# Patient Record
Sex: Female | Born: 1995 | Race: Black or African American | Hispanic: No | Marital: Single | Smoking: Current every day smoker
Health system: Southern US, Community
[De-identification: ages and names within clinical notes are randomized; demographics above are authoritative.]

## PROBLEM LIST (undated history)

## (undated) DIAGNOSIS — E119 Type 2 diabetes mellitus without complications: Secondary | ICD-10-CM

## (undated) HISTORY — PX: HERNIA REPAIR: SHX51

## (undated) HISTORY — PX: TONSILLECTOMY: SUR1361

---
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
## 2015-01-14 ENCOUNTER — Emergency Department: Payer: Self-pay | Admitting: Internal Medicine

## 2015-01-14 LAB — URIC ACID: Uric Acid: 6.8 mg/dL — ABNORMAL HIGH

## 2015-03-21 ENCOUNTER — Emergency Department
Admission: EM | Admit: 2015-03-21 | Discharge: 2015-03-21 | Discharge status: Against Medical Advice | Payer: Medicaid Other | Attending: Emergency Medicine | Admitting: Emergency Medicine

## 2015-03-21 ENCOUNTER — Encounter: Payer: Self-pay | Admitting: Emergency Medicine

## 2015-03-21 DIAGNOSIS — R1030 Lower abdominal pain, unspecified: Secondary | ICD-10-CM | POA: Insufficient documentation

## 2015-03-21 LAB — COMPREHENSIVE METABOLIC PANEL
ALT: 14 U/L (ref 14–54)
AST: 15 U/L (ref 15–41)
Albumin: 4.4 g/dL (ref 3.5–5.0)
Alkaline Phosphatase: 69 U/L (ref 38–126)
Anion gap: 6 (ref 5–15)
BILIRUBIN TOTAL: 0.3 mg/dL (ref 0.3–1.2)
BUN: 15 mg/dL (ref 6–20)
CO2: 28 mmol/L (ref 22–32)
Calcium: 9.2 mg/dL (ref 8.9–10.3)
Chloride: 107 mmol/L (ref 101–111)
Creatinine, Ser: 0.69 mg/dL (ref 0.44–1.00)
GFR calc non Af Amer: >60 mL/min (ref >60)
GLUCOSE: 93 mg/dL (ref 65–99)
Potassium: 4.2 mmol/L (ref 3.5–5.1)
SODIUM: 141 mmol/L (ref 135–145)
Total Protein: 7.9 g/dL (ref 6.5–8.1)

## 2015-03-21 LAB — URINALYSIS COMPLETE WITH MICROSCOPIC (ARMC ONLY)
Bilirubin Urine: NEGATIVE
GLUCOSE, UA: NEGATIVE mg/dL
Ketones, ur: NEGATIVE mg/dL
NITRITE: POSITIVE — AB
PROTEIN: 100 mg/dL — AB
Specific Gravity, Urine: 1.019 (ref 1.005–1.030)
pH: 6 (ref 5.0–8.0)

## 2015-03-21 LAB — CBC WITH DIFFERENTIAL/PLATELET
Basophils Absolute: 0.1 10*3/uL (ref 0–0.1)
Basophils Relative: 1 %
EOS PCT: 3 %
Eosinophils Absolute: 0.3 10*3/uL (ref 0–0.7)
HCT: 43.5 % (ref 35.0–47.0)
Hemoglobin: 13.8 g/dL (ref 12.0–16.0)
Lymphocytes Relative: 28 %
Lymphs Abs: 3.5 10*3/uL (ref 1.0–3.6)
MCH: 27.8 pg (ref 26.0–34.0)
MCHC: 31.7 g/dL — ABNORMAL LOW (ref 32.0–36.0)
MCV: 87.8 fL (ref 80.0–100.0)
MONOS PCT: 6 %
Monocytes Absolute: 0.7 10*3/uL (ref 0.2–0.9)
Neutro Abs: 7.8 10*3/uL — ABNORMAL HIGH (ref 1.4–6.5)
Neutrophils Relative %: 62 %
Platelets: 272 10*3/uL (ref 150–440)
RBC: 4.96 MIL/uL (ref 3.80–5.20)
RDW: 13.8 % (ref 11.5–14.5)
WBC: 12.4 10*3/uL — AB (ref 3.6–11.0)

## 2015-03-21 LAB — POCT PREGNANCY, URINE: Preg Test, Ur: NEGATIVE

## 2015-03-21 LAB — LIPASE, BLOOD: Lipase: 29 U/L (ref 22–51)

## 2015-03-21 NOTE — ED Notes (Signed)
Patient ambulatory to triage with steady gait, without difficulty or distress noted; pt reports x 2 days having lower abd pain radiating into pelvis with no accomp symptoms

## 2015-03-22 ENCOUNTER — Telehealth: Payer: Self-pay | Admitting: Emergency Medicine

## 2015-06-13 ENCOUNTER — Encounter: Payer: Self-pay | Admitting: Emergency Medicine

## 2015-06-13 ENCOUNTER — Emergency Department
Admission: EM | Admit: 2015-06-13 | Discharge: 2015-06-14 | Disposition: A | Discharge status: Home / Self Care | Payer: Medicaid Other | Attending: Student | Admitting: Student

## 2015-06-13 ENCOUNTER — Emergency Department: Payer: Medicaid Other

## 2015-06-13 DIAGNOSIS — R110 Nausea: Secondary | ICD-10-CM | POA: Insufficient documentation

## 2015-06-13 DIAGNOSIS — R1013 Epigastric pain: Secondary | ICD-10-CM

## 2015-06-13 DIAGNOSIS — Z3202 Encounter for pregnancy test, result negative: Secondary | ICD-10-CM | POA: Insufficient documentation

## 2015-06-13 DIAGNOSIS — I10 Essential (primary) hypertension: Secondary | ICD-10-CM | POA: Diagnosis not present

## 2015-06-13 DIAGNOSIS — Z79899 Other long term (current) drug therapy: Secondary | ICD-10-CM | POA: Diagnosis not present

## 2015-06-13 DIAGNOSIS — K8020 Calculus of gallbladder without cholecystitis without obstruction: Secondary | ICD-10-CM | POA: Insufficient documentation

## 2015-06-13 DIAGNOSIS — R52 Pain, unspecified: Secondary | ICD-10-CM

## 2015-06-13 LAB — URINALYSIS COMPLETE WITH MICROSCOPIC (ARMC ONLY)
Bacteria, UA: NONE SEEN
Bilirubin Urine: NEGATIVE
Glucose, UA: NEGATIVE mg/dL
Ketones, ur: NEGATIVE mg/dL
Leukocytes, UA: NEGATIVE
Nitrite: NEGATIVE
PH: 5 (ref 5.0–8.0)
PROTEIN: NEGATIVE mg/dL
Specific Gravity, Urine: 1.026 (ref 1.005–1.030)

## 2015-06-13 LAB — BASIC METABOLIC PANEL
Anion gap: 8 (ref 5–15)
BUN: 14 mg/dL (ref 6–20)
CALCIUM: 8.9 mg/dL (ref 8.9–10.3)
CHLORIDE: 108 mmol/L (ref 101–111)
CO2: 25 mmol/L (ref 22–32)
CREATININE: 0.72 mg/dL (ref 0.44–1.00)
GFR calc non Af Amer: >60 mL/min (ref >60)
Glucose, Bld: 97 mg/dL (ref 65–99)
Potassium: 3.6 mmol/L (ref 3.5–5.1)
Sodium: 141 mmol/L (ref 135–145)

## 2015-06-13 LAB — LIPASE, BLOOD: Lipase: 22 U/L (ref 22–51)

## 2015-06-13 LAB — CBC WITH DIFFERENTIAL/PLATELET
BASOS PCT: 1 %
Basophils Absolute: 0.1 10*3/uL (ref 0–0.1)
Eosinophils Absolute: 0.5 10*3/uL (ref 0–0.7)
Eosinophils Relative: 5 %
HEMATOCRIT: 42 % (ref 35.0–47.0)
HEMOGLOBIN: 13.5 g/dL (ref 12.0–16.0)
Lymphocytes Relative: 41 %
Lymphs Abs: 4 10*3/uL — ABNORMAL HIGH (ref 1.0–3.6)
MCH: 28.1 pg (ref 26.0–34.0)
MCHC: 32.1 g/dL (ref 32.0–36.0)
MCV: 87.5 fL (ref 80.0–100.0)
Monocytes Absolute: 0.5 10*3/uL (ref 0.2–0.9)
Monocytes Relative: 5 %
NEUTROS ABS: 4.9 10*3/uL (ref 1.4–6.5)
NEUTROS PCT: 50 %
Platelets: 275 10*3/uL (ref 150–440)
RBC: 4.8 MIL/uL (ref 3.80–5.20)
RDW: 13.7 % (ref 11.5–14.5)
WBC: 9.9 10*3/uL (ref 3.6–11.0)

## 2015-06-13 LAB — PREGNANCY, URINE: Preg Test, Ur: NEGATIVE

## 2015-06-13 NOTE — ED Provider Notes (Signed)
[facility] Emergency Department Provider Note  ____________________________________________  Time seen: Approximately 10:17 PM  I have reviewed the triage vital signs and the nursing notes.   HISTORY  Chief Complaint Abdominal Pain; Nausea; and Diarrhea    HPI Bonnie Duncan is a 19 y.o. female with history of diabetes who presents for evaluation of one week intermittent epigastric abdominal pain, worse with eating associated with nausea, gradual onset. She reports that the pain is "sharp". She has also had intermittent nonbloody diarrhea. No fevers or chills. No chest pain or difficulty breathing. Currently her symptoms are mild. She has had no vomiting.   History reviewed. No pertinent past medical history.  Patient Active Problem List   Diagnosis Date Noted  . Obesity 02/04/2011  . Pre-diabetes 02/04/2011  . Essential hypertension, benign 02/04/2011  . Acquired acanthosis nigricans 02/04/2011    History reviewed. No pertinent past surgical history.  Current Outpatient Rx  Name  Route  Sig  Dispense  Refill  . metFORMIN (GLUCOPHAGE) 500 MG tablet   Oral   Take 500 mg by mouth 2 (two) times daily with a meal.             Allergies Review of patient's allergies indicates no known allergies.  History reviewed. No pertinent family history.  Social History Social History  Substance Use Topics  . Smoking status: Never Smoker   . Smokeless tobacco: None  . Alcohol Use: No    Review of Systems Constitutional: No fever/chills Eyes: No visual changes. ENT: No sore throat. Cardiovascular: Denies chest pain. Respiratory: Denies shortness of breath. Gastrointestinal: = abdominal pain.  + nausea, no vomiting.  + diarrhea.  No constipation. Genitourinary: Negative for dysuria. Musculoskeletal: Negative for back pain. Skin: Negative for rash. Neurological: Negative for headaches, focal weakness or numbness.  10-point ROS otherwise  negative.  ____________________________________________   PHYSICAL EXAM:  VITAL SIGNS: ED Triage Vitals  Enc Vitals Group     BP 06/13/15 2150 137/68 mmHg     Pulse Rate 06/13/15 2150 86     Resp 06/13/15 2150 18     Temp 06/13/15 2150 98.2 F (36.8 C)     Temp Source 06/13/15 2150 Oral     SpO2 06/13/15 2150 99 %     Weight 06/13/15 2150 293 lb (132.904 kg)     Height 06/13/15 2150 5\' 1"  (1.549 m)     Head Cir --      Peak Flow --      Pain Score 06/13/15 2150 9     Pain Loc --      Pain Edu? --      Excl. in GC? --     Constitutional: Alert and oriented. Well appearing and in no acute distress. Eyes: Conjunctivae are normal. PERRL. EOMI. Head: Atraumatic. Nose: No congestion/rhinnorhea. Mouth/Throat: Mucous membranes are moist.  Oropharynx non-erythematous. Neck: No stridor.  Cardiovascular: Normal rate, regular rhythm. Grossly normal heart sounds.  Good peripheral circulation. Respiratory: Normal respiratory effort.  No retractions. Lungs CTAB. Gastrointestinal: Obese abdomen with mild tenderness to palpation in the epigastrium, no rebound or guarding. Genitourinary: deferred Musculoskeletal: No lower extremity tenderness nor edema.  No joint effusions. Neurologic:  Normal speech and language. No gross focal neurologic deficits are appreciated. No gait instability. Skin:  Skin is warm, dry and intact. No rash noted. Psychiatric: Mood and affect are normal. Speech and behavior are normal.  ____________________________________________   LABS (all labs ordered are listed, but only abnormal results are  displayed)  Labs Reviewed  URINALYSIS COMPLETEWITH MICROSCOPIC ([facility] ONLY) - Abnormal; Notable for the following:    Color, Urine YELLOW (*)    APPearance CLEAR (*)    Hgb urine dipstick 1+ (*)    Squamous Epithelial / LPF 0-5 (*)    All other components within normal limits  CBC WITH DIFFERENTIAL/PLATELET - Abnormal; Notable for the following:    Lymphs Abs 4.0  (*)    All other components within normal limits  BASIC METABOLIC PANEL  LIPASE, BLOOD  PREGNANCY, URINE  HEPATIC FUNCTION PANEL  POC URINE PREG, ED   ____________________________________________  EKG  None ____________________________________________  RADIOLOGY  Right upper quadrant ultrasound pending ____________________________________________   PROCEDURES  Procedure(s) performed: None  Critical Care performed: No  ____________________________________________   INITIAL IMPRESSION / ASSESSMENT AND PLAN / ED COURSE  Pertinent labs & imaging results that were available during my care of the patient were reviewed by me and considered in my medical decision making (see chart for details).  Bonnie Duncan is a 19 y.o. female with history of diabetes who presents for evaluation of one week intermittent epigastric abdominal pain, worse with eating associated with nausea, gradual onset. On exam, she is very well-appearing and in no acute distress. Vital signs stable, she is afebrile. Mild tenderness to palpation in the epigastrium. Normal BMP. Normal lipase. Normal CBC. Normal urinalysis. Negative pregnancy. Possibly viral syndrome however Hepatic function panel pending as is right upper quadrant ultrasound to evaluate for any acute gallbladder pathology. Care transferred to Dr. Manson Passey at 11:35 PM. ____________________________________________   FINAL CLINICAL IMPRESSION(S) / ED DIAGNOSES  Final diagnoses:  Pain  Acute epigastric pain  Nausea      Gayla Doss, MD 06/13/15 2336

## 2015-06-14 LAB — HEPATIC FUNCTION PANEL
ALT: 13 U/L — ABNORMAL LOW (ref 14–54)
AST: 24 U/L (ref 15–41)
Albumin: 4.1 g/dL (ref 3.5–5.0)
Alkaline Phosphatase: 59 U/L (ref 38–126)
Total Bilirubin: 0.4 mg/dL (ref 0.3–1.2)
Total Protein: 7.2 g/dL (ref 6.5–8.1)

## 2015-06-14 MED ORDER — ONDANSETRON 4 MG PO TBDP: 4.0000 mg | ORAL_TABLET | Freq: Three times a day (TID) | ORAL | Status: DC | PRN

## 2015-06-14 MED ORDER — OXYCODONE-ACETAMINOPHEN 5-325 MG PO TABS: 1.0000 | ORAL_TABLET | ORAL | Status: DC | PRN

## 2015-06-14 NOTE — ED Notes (Signed)
Patient transported to US 

## 2015-06-14 NOTE — ED Provider Notes (Signed)
I assumed care of the patient from Dr. Inocencio Homes at 11:30 PM. Patient's ultrasound the abdomen revealed: US Abdomen Limited RUQ (Final result) Result time: 06/14/15 01:12:44   Final result by Rad Results In Interface (06/14/15 01:12:44)   Narrative:   CLINICAL DATA: Abdominal pain for 1 week.  EXAM: US ABDOMEN LIMITED - RIGHT UPPER QUADRANT  COMPARISON: 01/07/2013  FINDINGS: Gallbladder:  Multiple stones identified in the gallbladder. Largest measuring about 8 mm. Gallbladder is somewhat contracted. No wall thickening. Murphy's sign is negative.  Common bile duct:  Diameter: 2.2 mm common normal  Liver:  No focal lesion identified. Within normal limits in parenchymal echogenicity.  IMPRESSION: Cholelithiasis with multiple stones demonstrated in the gallbladder. No additional inflammatory changes.   Electronically Signed By: Burman Nieves M.D. On: 06/14/2015 01:12      Patient was informed of all clinical findings including the ultrasound. Patient will be referred to Dr. Michela Pitcher for outpatient follow-up.  Darci Current, MD 06/14/15 [phone]

## 2015-06-14 NOTE — Discharge Instructions (Signed)
Cholelithiasis °Cholelithiasis (also called gallstones) is a form of gallbladder disease in which gallstones form in your gallbladder. The gallbladder is an organ that stores bile made in the liver, which helps digest fats. Gallstones begin as small crystals and slowly grow into stones. Gallstone pain occurs when the gallbladder spasms and a gallstone is blocking the duct. Pain can also occur when a stone passes out of the duct.  °RISK FACTORS °· Being female.   °· Having multiple pregnancies. Health care providers sometimes advise removing diseased gallbladders before future pregnancies.   °· Being obese. °· Eating a diet heavy in fried foods and fat.   °· Being older than 60 years and increasing age.   °· Prolonged use of medicines containing female hormones.   °· Having diabetes mellitus.   °· Rapidly losing weight.   °· Having a family history of gallstones (heredity).   °SYMPTOMS °· Nausea.   °· Vomiting. °· Abdominal pain.   °· Yellowing of the skin (jaundice).   °· Sudden pain. It may persist from several minutes to several hours. °· Fever.   °· Tenderness to the touch.  °In some cases, when gallstones do not move into the bile duct, people have no pain or symptoms. These are called "silent" gallstones.  °TREATMENT °Silent gallstones do not need treatment. In severe cases, emergency surgery may be required. Options for treatment include: °· Surgery to remove the gallbladder. This is the most common treatment. °· Medicines. These do not always work and may take 6-12 months or more to work. °· Shock wave treatment (extracorporeal biliary lithotripsy). In this treatment an ultrasound machine sends shock waves to the gallbladder to break gallstones into smaller pieces that can pass into the intestines or be dissolved by medicine. °HOME CARE INSTRUCTIONS  °· Only take over-the-counter or prescription medicines for pain, discomfort, or fever as directed by your health care provider.   °· Follow a low-fat diet until  seen again by your health care provider. Fat causes the gallbladder to contract, which can result in pain.   °· Follow up with your health care provider as directed. Attacks are almost always recurrent and surgery is usually required for permanent treatment.   °SEEK IMMEDIATE MEDICAL CARE IF:  °· Your pain increases and is not controlled by medicines.   °· You have a fever or persistent symptoms for more than 2-3 days.   °· You have a fever and your symptoms suddenly get worse.   °· You have persistent nausea and vomiting.   °MAKE SURE YOU:  °· Understand these instructions. °· Will watch your condition. °· Will get help right away if you are not doing well or get worse. °Document Released: 10/02/2005 Document Revised: 06/08/2013 Document Reviewed: 03/30/2013 °[employer]® Patient Information ©2015 [employer]. This information is not intended to replace advice given to you by your health care provider. Make sure you discuss any questions you have with your health care provider. ° °

## 2015-06-14 NOTE — ED Notes (Signed)
Patient returned from US.

## 2016-04-12 ENCOUNTER — Encounter: Payer: Self-pay | Admitting: *Deleted

## 2016-04-12 ENCOUNTER — Other Ambulatory Visit: Payer: Self-pay

## 2016-04-12 ENCOUNTER — Emergency Department: Payer: Self-pay

## 2016-04-12 ENCOUNTER — Emergency Department
Admission: EM | Admit: 2016-04-12 | Discharge: 2016-04-12 | Disposition: A | Discharge status: Home / Self Care | Payer: Self-pay | Attending: Student | Admitting: Student

## 2016-04-12 DIAGNOSIS — E119 Type 2 diabetes mellitus without complications: Secondary | ICD-10-CM | POA: Insufficient documentation

## 2016-04-12 DIAGNOSIS — L309 Dermatitis, unspecified: Secondary | ICD-10-CM | POA: Insufficient documentation

## 2016-04-12 DIAGNOSIS — R0789 Other chest pain: Secondary | ICD-10-CM | POA: Insufficient documentation

## 2016-04-12 DIAGNOSIS — I10 Essential (primary) hypertension: Secondary | ICD-10-CM | POA: Insufficient documentation

## 2016-04-12 HISTORY — DX: Type 2 diabetes mellitus without complications: E11.9

## 2016-04-12 LAB — COMPREHENSIVE METABOLIC PANEL
ALT: 17 U/L (ref 14–54)
AST: 19 U/L (ref 15–41)
Albumin: 4.4 g/dL (ref 3.5–5.0)
Alkaline Phosphatase: 66 U/L (ref 38–126)
Anion gap: 7 (ref 5–15)
BILIRUBIN TOTAL: 0.3 mg/dL (ref 0.3–1.2)
BUN: 13 mg/dL (ref 6–20)
CO2: 23 mmol/L (ref 22–32)
CREATININE: 0.72 mg/dL (ref 0.44–1.00)
Calcium: 9.1 mg/dL (ref 8.9–10.3)
Chloride: 106 mmol/L (ref 101–111)
GFR calc Af Amer: >60 mL/min (ref >60)
Glucose, Bld: 108 mg/dL — ABNORMAL HIGH (ref 65–99)
POTASSIUM: 4.2 mmol/L (ref 3.5–5.1)
Sodium: 136 mmol/L (ref 135–145)
Total Protein: 7.8 g/dL (ref 6.5–8.1)

## 2016-04-12 LAB — CBC
HEMATOCRIT: 42.1 % (ref 35.0–47.0)
Hemoglobin: 14.3 g/dL (ref 12.0–16.0)
MCH: 29.2 pg (ref 26.0–34.0)
MCHC: 34 g/dL (ref 32.0–36.0)
MCV: 85.9 fL (ref 80.0–100.0)
PLATELETS: 250 10*3/uL (ref 150–440)
RBC: 4.91 MIL/uL (ref 3.80–5.20)
RDW: 13.5 % (ref 11.5–14.5)
WBC: 10.3 10*3/uL (ref 3.6–11.0)

## 2016-04-12 LAB — TROPONIN I: Troponin I: <0.03 ng/mL (ref <0.031)

## 2016-04-12 LAB — POCT PREGNANCY, URINE: Preg Test, Ur: NEGATIVE

## 2016-04-12 LAB — FIBRIN DERIVATIVES D-DIMER (ARMC ONLY): FIBRIN DERIVATIVES D-DIMER (ARMC): 512 — AB (ref 0–499)

## 2016-04-12 MED ORDER — KETOROLAC TROMETHAMINE 30 MG/ML IJ SOLN
15.0000 mg | Freq: Once | INTRAMUSCULAR | Status: AC
  Administered 2016-04-12: 15 mg via INTRAVENOUS
  Filled 2016-04-12: qty 1

## 2016-04-12 MED ORDER — SODIUM CHLORIDE 0.9 % IV BOLUS (SEPSIS)
500.0000 mL | Freq: Once | INTRAVENOUS | Status: AC
  Administered 2016-04-12: 500 mL via INTRAVENOUS

## 2016-04-12 MED ORDER — IOPAMIDOL (ISOVUE-370) INJECTION 76%
75.0000 mL | Freq: Once | INTRAVENOUS | Status: AC | PRN
  Administered 2016-04-12: 75 mL via INTRAVENOUS

## 2016-04-12 MED ORDER — NAPROXEN 500 MG PO TABS: 500.0000 mg | ORAL_TABLET | Freq: Two times a day (BID) | ORAL | Status: AC

## 2016-04-12 MED ORDER — OXYCODONE HCL 5 MG PO TABS
5.0000 mg | ORAL_TABLET | Freq: Once | ORAL | Status: AC
  Administered 2016-04-12: 5 mg via ORAL
  Filled 2016-04-12: qty 1

## 2016-04-12 NOTE — ED Provider Notes (Signed)
[facility] Emergency Department Provider Note   ____________________________________________  Time seen: Approximately 6:29 PM  I have reviewed the triage vital signs and the nursing notes.   HISTORY  Chief Complaint Chest Pain and Rash    HPI Bonnie Duncan is a 20 y.o. female with history of diabetes, eczema who presents for evaluation of 3 days of constant left-sided chest pain worse with inspiration and movement of the left arm, gradual onset, constant, moderate. She denies any trauma. She denies any shortness of breath. She denies any hemoptysis, recent surgeries or recent prolonged period of immobilization. She is also complaining that her eczema has been flaring up. No family history of early coronary artery disease or family history of sudden cardiac death.   Past Medical History  Diagnosis Date  . Diabetes mellitus without complication [facility](HCC)     Patient Active Problem List   Diagnosis Date Noted  . Obesity 02/04/2011  . Pre-diabetes 02/04/2011  . Essential hypertension, benign 02/04/2011  . Acquired acanthosis nigricans 02/04/2011    No past surgical history on file.  Current Outpatient Rx  Name  Route  Sig  Dispense  Refill  . ondansetron (ZOFRAN ODT) 4 MG disintegrating tablet   Oral   Take 1 tablet (4 mg total) by mouth every 8 (eight) hours as needed for nausea or vomiting.   20 tablet   0   . oxyCODONE-acetaminophen (ROXICET) 5-325 MG per tablet   Oral   Take 1 tablet by mouth every 4 (four) hours as needed for severe pain.   20 tablet   0     Allergies Review of patient's allergies indicates no known allergies.  No family history on file.  Social History Social History  Substance Use Topics  . Smoking status: Never Smoker   . Smokeless tobacco: None  . Alcohol Use: No    Review of Systems Constitutional: No fever/chills Eyes: No visual changes. ENT: No sore throat. Cardiovascular: + chest  pain. Respiratory: Denies shortness of breath. Gastrointestinal: No abdominal pain.  No nausea, no vomiting.  No diarrhea.  No constipation. Genitourinary: Negative for dysuria. Musculoskeletal: Negative for back pain. Skin: Negative for rash. Neurological: Negative for headaches, focal weakness or numbness.  10-point ROS otherwise negative.  ____________________________________________   PHYSICAL EXAM:  Filed Vitals:   04/12/16 1652 04/12/16 1654 04/12/16 1920 04/12/16 2036  BP:  122/70 121/64 118/65  Pulse: 110  89 93  Temp: 98.8 F (37.1 C)  98.7 F (37.1 C)   TempSrc: Oral     Resp: 20  20 20   Height: 5\' 1"  (1.549 m)     Weight: 293 lb (132.904 kg)     SpO2: 100%  99% 99%    VITAL SIGNS: ED Triage Vitals  Enc Vitals Group     BP 04/12/16 1654 122/70 mmHg     Pulse Rate 04/12/16 1652 110     Resp 04/12/16 1652 20     Temp 04/12/16 1652 98.8 F (37.1 C)     Temp Source 04/12/16 1652 Oral     SpO2 04/12/16 1652 100 %     Weight 04/12/16 1652 293 lb (132.904 kg)     Height 04/12/16 1652 5\' 1"  (1.549 m)     Head Cir --      Peak Flow --      Pain Score 04/12/16 1654 9     Pain Loc --      Pain Edu? --  Excl. in GC? --     Constitutional: Alert and oriented. Well appearing and in no acute distress. +Morbidly obese. Eyes: Conjunctivae are normal. PERRL. EOMI. Head: Atraumatic. Nose: No congestion/rhinnorhea. Mouth/Throat: Mucous membranes are moist.  Oropharynx non-erythematous. Neck: No stridor.   Cardiovascular: Tachycardic rate, regular rhythm. Grossly normal heart sounds.  Good peripheral circulation. Respiratory: Normal respiratory effort.  No retractions. Lungs CTAB. Gastrointestinal: Soft and nontender. No distention. No CVA tenderness. Genitourinary: deferred Musculoskeletal: No lower extremity tenderness nor edema.  No joint effusions. Tenderness to palpation in the left anterior/superior chest wall, the patient reproduces her pain as does movement  of the left shoulder. 2+ radial pulses bilaterally. Neurologic:  Normal speech and language. No gross focal neurologic deficits are appreciated. No gait instability. Skin:  Skin is warm, dry and intact. Eczematous rash is noted throughout the arms and upper chest with some areas that are excoriated but no surrounding erythema or warmth or other evidence of bacterial superinfection. Psychiatric: Mood and affect are normal. Speech and behavior are normal.  ____________________________________________   LABS (all labs ordered are listed, but only abnormal results are displayed)  Labs Reviewed  COMPREHENSIVE METABOLIC PANEL - Abnormal; Notable for the following:    Glucose, Bld 108 (*)    All other components within normal limits  FIBRIN DERIVATIVES D-DIMER ([facility] ONLY) - Abnormal; Notable for the following:    Fibrin derivatives D-dimer ([facility]) 512 (*)    All other components within normal limits  CBC  TROPONIN I  POC URINE PREG, ED  POCT PREGNANCY, URINE   ____________________________________________  EKG  ED ECG REPORT I, Gayla Doss, the attending physician, personally viewed and interpreted this ECG.   Date: 04/12/2016  EKG Time: 17:00  Rate: 101  Rhythm: sinus tachycardia  Axis: normal  Intervals:none  ST&T Change: No acute ST elevation or ST depression.  ____________________________________________  RADIOLOGY  CXR IMPRESSION: No active cardiopulmonary disease.  CTA chest IMPRESSION: 1. The study is somewhat limited due to patient body habitus. However, no central pulmonary emboli are identified. 2. No other acute abnormalities are seen. The pulmonary nodules described above do not require follow-up in this 20 year old patient unless she is at high risk for malignancy. ____________________________________________   PROCEDURES  Procedure(s) performed: None  Critical Care performed: No  ____________________________________________   INITIAL IMPRESSION  / ASSESSMENT AND PLAN / ED COURSE  Pertinent labs & imaging results that were available during my care of the patient were reviewed by me and considered in my medical decision making (see chart for details).  Bonnie Duncan is a 20 y.o. female with history of diabetes, eczema who presents for evaluation of 3 days of constant left-sided chest pain worse with inspiration and movement of the left arm, gradual onset. On exam, she is very well-appearing and in no acute distress. Vital signs are notable for very mild tachycardia however the remainder of her vital signs are stable and she is afebrile. EKG shows sinus tachycardia, not dizzy with acute ischemia. Her pain appears reproducible on exam and I suspect ultimately this will represent musculoskeletal chest pain however given her tachycardia and her complaint of pleuritic pain, d-dimer was obtained and is mildly elevated so we'll pursue CTA chest. Troponin negative, doubt ACS. CBC and BMP unremarkable. Negative pregnancy test. Chest x-ray shows no acute cardio pulmonary disease. We'll treat her pain. I recommended use of emollients for her eczematous rash.  ----------------------------------------- 8:47 PM on 04/12/2016 -----------------------------------------  CTA chest shows no ventral PE. The  patient continues to appear comfortable, she is requesting discharge. We'll DC with return precautions, close PCP follow-up. She is comfortable with the discharge plan. Her tachycardia has resolved. ____________________________________________   FINAL CLINICAL IMPRESSION(S) / ED DIAGNOSES  Final diagnoses:  Chest pain, musculoskeletal  Eczema      NEW MEDICATIONS STARTED DURING THIS VISIT:  New Prescriptions   No medications on file     Note:  This document was prepared using Dragon voice recognition software and may include unintentional dictation errors.    Gayla DossEryka A Maranda Marte, MD 04/12/16 2048

## 2016-04-12 NOTE — ED Notes (Signed)
Pt ambulatory to triage.  Pt has chest pain for 3 days and a rash on arms and chest for 1 week.  No sob.  Nonsmoker.  No n/v/d.  Pt alert  Speech clear.

## 2016-05-31 ENCOUNTER — Emergency Department
Admission: EM | Admit: 2016-05-31 | Discharge: 2016-05-31 | Disposition: A | Discharge status: Home / Self Care | Payer: Medicaid Other | Attending: Emergency Medicine | Admitting: Emergency Medicine

## 2016-05-31 ENCOUNTER — Encounter: Payer: Self-pay | Admitting: Emergency Medicine

## 2016-05-31 DIAGNOSIS — Z791 Long term (current) use of non-steroidal anti-inflammatories (NSAID): Secondary | ICD-10-CM | POA: Insufficient documentation

## 2016-05-31 DIAGNOSIS — E119 Type 2 diabetes mellitus without complications: Secondary | ICD-10-CM | POA: Insufficient documentation

## 2016-05-31 DIAGNOSIS — I10 Essential (primary) hypertension: Secondary | ICD-10-CM | POA: Insufficient documentation

## 2016-05-31 DIAGNOSIS — N939 Abnormal uterine and vaginal bleeding, unspecified: Secondary | ICD-10-CM | POA: Insufficient documentation

## 2016-05-31 LAB — COMPREHENSIVE METABOLIC PANEL
ALBUMIN: 3.9 g/dL (ref 3.5–5.0)
ALK PHOS: 60 U/L (ref 38–126)
ALT: 16 U/L (ref 14–54)
AST: 19 U/L (ref 15–41)
Anion gap: 4 — ABNORMAL LOW (ref 5–15)
BILIRUBIN TOTAL: 0.4 mg/dL (ref 0.3–1.2)
BUN: 15 mg/dL (ref 6–20)
CO2: 24 mmol/L (ref 22–32)
CREATININE: 0.6 mg/dL (ref 0.44–1.00)
Calcium: 8.4 mg/dL — ABNORMAL LOW (ref 8.9–10.3)
Chloride: 110 mmol/L (ref 101–111)
GFR calc Af Amer: >60 mL/min (ref >60)
GLUCOSE: 95 mg/dL (ref 65–99)
POTASSIUM: 3.7 mmol/L (ref 3.5–5.1)
Sodium: 138 mmol/L (ref 135–145)
TOTAL PROTEIN: 6.9 g/dL (ref 6.5–8.1)

## 2016-05-31 LAB — URINALYSIS COMPLETE WITH MICROSCOPIC (ARMC ONLY)
Bacteria, UA: NONE SEEN
Bilirubin Urine: NEGATIVE
GLUCOSE, UA: NEGATIVE mg/dL
KETONES UR: NEGATIVE mg/dL
Leukocytes, UA: NEGATIVE
Nitrite: POSITIVE — AB
PROTEIN: NEGATIVE mg/dL
Specific Gravity, Urine: 1.026 (ref 1.005–1.030)
pH: 6 (ref 5.0–8.0)

## 2016-05-31 LAB — CBC
HEMATOCRIT: 38.5 % (ref 35.0–47.0)
HEMOGLOBIN: 13 g/dL (ref 12.0–16.0)
MCH: 29.2 pg (ref 26.0–34.0)
MCHC: 33.8 g/dL (ref 32.0–36.0)
MCV: 86.4 fL (ref 80.0–100.0)
Platelets: 247 10*3/uL (ref 150–440)
RBC: 4.46 MIL/uL (ref 3.80–5.20)
RDW: 13.8 % (ref 11.5–14.5)
WBC: 8.3 10*3/uL (ref 3.6–11.0)

## 2016-05-31 LAB — TYPE AND SCREEN
ABO/RH(D): O POS
Antibody Screen: NEGATIVE

## 2016-05-31 LAB — POCT PREGNANCY, URINE: PREG TEST UR: NEGATIVE

## 2016-05-31 MED ORDER — CEPHALEXIN 500 MG PO CAPS: 500.0000 mg | ORAL_CAPSULE | Freq: Three times a day (TID) | ORAL | 0 refills | Status: DC

## 2016-05-31 MED ORDER — IBUPROFEN 800 MG PO TABS: 800.0000 mg | ORAL_TABLET | Freq: Three times a day (TID) | ORAL | 0 refills | Status: DC | PRN

## 2016-05-31 NOTE — ED Triage Notes (Signed)
Pt reports normal period beginning June missed July and woke up 2 days ago with heavy vaginal bleeding. Has lower abdominal cramps she reports worse than normal.

## 2016-05-31 NOTE — ED Provider Notes (Signed)
[facility] Emergency Department Provider Note  Time seen: 9:32 AM  I have reviewed the triage vital signs and the nursing notes.   HISTORY  Chief Complaint Vaginal Bleeding    HPI Bonnie Duncan is a 20 y.o. female with a past medical history of diabetes who presents the emergency department vaginal bleeding. According to the patient she skipped her period last month which is abnormal for her. She started with vaginal bleeding and lower abdominal cramping yesterday which she states is heavier than normal. States 8-10 tampons used throughout the day yesterday. Continued vaginal bleeding today so she came to the emergency department for evaluation. Denies lightheadedness or syncope.Patient states her periods are normally very regular. Describes the abdominal discomfort is moderate cramping sensation located in the lower abdomen.  Past Medical History:  Diagnosis Date  . Diabetes mellitus without complication [facility])     Patient Active Problem List   Diagnosis Date Noted  . Obesity 02/04/2011  . Pre-diabetes 02/04/2011  . Essential hypertension, benign 02/04/2011  . Acquired acanthosis nigricans 02/04/2011    History reviewed. No pertinent surgical history.  Prior to Admission medications   Medication Sig Start Date End Date Taking? Authorizing Provider  naproxen (NAPROSYN) 500 MG tablet Take 1 tablet (500 mg total) by mouth 2 (two) times daily with a meal. 04/12/16 04/12/17  Gayla Doss, MD  ondansetron (ZOFRAN ODT) 4 MG disintegrating tablet Take 1 tablet (4 mg total) by mouth every 8 (eight) hours as needed for nausea or vomiting. 06/14/15   Darci Current, MD  oxyCODONE-acetaminophen (ROXICET) 5-325 MG per tablet Take 1 tablet by mouth every 4 (four) hours as needed for severe pain. 06/14/15   Darci Current, MD    No Known Allergies  History reviewed. No pertinent family history.  Social History Social History  Substance Use Topics  . Smoking  status: Never Smoker  . Smokeless tobacco: Not on file  . Alcohol use No    Review of Systems Constitutional: Negative for fever. Cardiovascular: Negative for chest pain. Respiratory: Negative for shortness of breath. Gastrointestinal: Lower abdominal cramping. Negative nausea, vomiting and diarrhea. Genitourinary: Negative for dysuria. Positive for vaginal bleeding. Musculoskeletal: Negative for back pain Neurological: Negative for headaches, focal weakness or numbness. 10-point ROS otherwise negative.  ____________________________________________   PHYSICAL EXAM:  VITAL SIGNS: ED Triage Vitals  Enc Vitals Group     BP 05/31/16 0912 (!) 160/95     Pulse Rate 05/31/16 0912 77     Resp 05/31/16 0912 20     Temp 05/31/16 0911 98.4 F (36.9 C)     Temp Source 05/31/16 0911 Oral     SpO2 05/31/16 0912 98 %     Weight 05/31/16 0912 293 lb (132.9 kg)     Height 05/31/16 0912  (1.549 m)     Head Circumference --      Peak Flow --      Pain Score 05/31/16 0911 9     Pain Loc --      Pain Edu? --      Excl. in GC? --     Constitutional: Alert and oriented. Well appearing and in no distress. Eyes: Normal exam ENT   Head: Normocephalic and atraumatic.   Mouth/Throat: Mucous membranes are moist. Cardiovascular: Normal rate, regular rhythm. No murmur Respiratory: Normal respiratory effort without tachypnea nor retractions. Breath sounds are clear Gastrointestinal: Soft, mild epigastric and lower abdominal tenderness to palpation without rebound or guarding. No  distention. Obese. Musculoskeletal: Nontender with normal range of motion in all extremities.  Neurologic:  Normal speech and language. No gross focal neurologic deficits are appreciated. Skin:  Skin is warm, dry and intact.  Psychiatric: Mood and affect are normal. Speech and behavior are normal.   ____________________________________________   INITIAL IMPRESSION / ASSESSMENT AND PLAN / ED  COURSE  Pertinent labs & imaging results that were available during my care of the patient were reviewed by me and considered in my medical decision making (see chart for details).  The patient presents the emergency department with lower abdominal cramping and vaginal bleeding. Patient states it is very abnormal for her to skip a period which has concerned her. We will check labs, and closely monitor in the emergency department. Currently the patient appears overall very well. No distress.  Labs are positive for a urinary tract infection, otherwise within normal limits including H&H. We'll place the patient on ibuprofen to be taken as needed for menstrual cramping, and have her follow-up with OB/GYN. Patient is agreeable to this plan.  ____________________________________________   FINAL CLINICAL IMPRESSION(S) / ED DIAGNOSES  Vaginal bleeding    Minna AntisKevin Nikkita Adeyemi, MD 05/31/16 1054

## 2016-05-31 NOTE — ED Notes (Signed)
10+ Tampons per 24hr, small clots noted on tampon. Mild to moderate abd cramping.

## 2016-06-05 ENCOUNTER — Emergency Department
Admission: EM | Admit: 2016-06-05 | Discharge: 2016-06-05 | Disposition: A | Discharge status: Home / Self Care | Payer: Medicaid Other | Attending: Emergency Medicine | Admitting: Emergency Medicine

## 2016-06-05 ENCOUNTER — Emergency Department: Payer: Medicaid Other

## 2016-06-05 ENCOUNTER — Encounter: Payer: Self-pay | Admitting: Emergency Medicine

## 2016-06-05 DIAGNOSIS — I10 Essential (primary) hypertension: Secondary | ICD-10-CM | POA: Insufficient documentation

## 2016-06-05 DIAGNOSIS — Z791 Long term (current) use of non-steroidal anti-inflammatories (NSAID): Secondary | ICD-10-CM | POA: Insufficient documentation

## 2016-06-05 DIAGNOSIS — E119 Type 2 diabetes mellitus without complications: Secondary | ICD-10-CM | POA: Insufficient documentation

## 2016-06-05 DIAGNOSIS — R0789 Other chest pain: Secondary | ICD-10-CM | POA: Insufficient documentation

## 2016-06-05 LAB — BASIC METABOLIC PANEL
Anion gap: 5 (ref 5–15)
BUN: 14 mg/dL (ref 6–20)
CHLORIDE: 107 mmol/L (ref 101–111)
CO2: 28 mmol/L (ref 22–32)
Calcium: 9.1 mg/dL (ref 8.9–10.3)
Creatinine, Ser: 0.59 mg/dL (ref 0.44–1.00)
GFR calc Af Amer: >60 mL/min (ref >60)
GFR calc non Af Amer: >60 mL/min (ref >60)
GLUCOSE: 93 mg/dL (ref 65–99)
POTASSIUM: 4 mmol/L (ref 3.5–5.1)
Sodium: 140 mmol/L (ref 135–145)

## 2016-06-05 LAB — CBC
HEMATOCRIT: 41.5 % (ref 35.0–47.0)
Hemoglobin: 13.7 g/dL (ref 12.0–16.0)
MCH: 28.9 pg (ref 26.0–34.0)
MCHC: 32.9 g/dL (ref 32.0–36.0)
MCV: 87.8 fL (ref 80.0–100.0)
Platelets: 260 10*3/uL (ref 150–440)
RBC: 4.72 MIL/uL (ref 3.80–5.20)
RDW: 13.7 % (ref 11.5–14.5)
WBC: 9 10*3/uL (ref 3.6–11.0)

## 2016-06-05 LAB — TROPONIN I
Troponin I: <0.03 ng/mL (ref <0.03)
Troponin I: <0.03 ng/mL (ref <0.03)

## 2016-06-05 NOTE — ED Provider Notes (Signed)
[facility] Emergency Department Provider Note   ____________________________________________    I have reviewed the triage vital signs and the nursing notes.   HISTORY  Chief Complaint Chest Pain and Dizziness     HPI Bonnie Duncan is a 20 y.o. female who presents with complaints of chest pain. Patient reports the pain started approximately 7 AM this morning. It was sharp and brief in nature. Currently she feels well. She denies shortness of breath. No recent travel. No calf pain or swelling. No fevers chills or cough. She has had this once before.No injury to the area   Past Medical History:  Diagnosis Date  . Diabetes mellitus without complication [facility](HCC)     Patient Active Problem List   Diagnosis Date Noted  . Obesity 02/04/2011  . Pre-diabetes 02/04/2011  . Essential hypertension, benign 02/04/2011  . Acquired acanthosis nigricans 02/04/2011    History reviewed. No pertinent surgical history.  Prior to Admission medications   Medication Sig Start Date End Date Taking? Authorizing Provider  cephALEXin (KEFLEX) 500 MG capsule Take 1 capsule (500 mg total) by mouth 3 (three) times daily. 05/31/16   Minna AntisKevin Paduchowski, MD  ibuprofen (ADVIL,MOTRIN) 800 MG tablet Take 1 tablet (800 mg total) by mouth every 8 (eight) hours as needed. 05/31/16   Minna AntisKevin Paduchowski, MD  naproxen (NAPROSYN) 500 MG tablet Take 1 tablet (500 mg total) by mouth 2 (two) times daily with a meal. Patient not taking: Reported on 05/31/2016 04/12/16 04/12/17  Gayla DossEryka A Gayle, MD  ondansetron (ZOFRAN ODT) 4 MG disintegrating tablet Take 1 tablet (4 mg total) by mouth every 8 (eight) hours as needed for nausea or vomiting. Patient not taking: Reported on 05/31/2016 06/14/15   Darci Currentandolph N Brown, MD  oxyCODONE-acetaminophen (ROXICET) 5-325 MG per tablet Take 1 tablet by mouth every 4 (four) hours as needed for severe pain. Patient not taking: Reported on 05/31/2016 06/14/15   Darci Currentandolph N  Brown, MD     Allergies Review of patient's allergies indicates no known allergies.  No family history on file.  Social History Social History  Substance Use Topics  . Smoking status: Never Smoker  . Smokeless tobacco: Never Used  . Alcohol use No    Review of Systems  Constitutional: No fever/chills  Cardiovascular: As above Respiratory: Denies shortness of breath. Gastrointestinal: No abdominal pain.  No nausea, no vomiting.    Musculoskeletal: Negative for back pain. Skin: Negative for rash. Neurological: Negative for headaches or weakness  10-point ROS otherwise negative.  ____________________________________________   PHYSICAL EXAM:  VITAL SIGNS: ED Triage Vitals  Enc Vitals Group     BP 06/05/16 1129 (!) 148/99     Pulse Rate 06/05/16 1129 86     Resp 06/05/16 1129 16     Temp 06/05/16 1129 98.1 F (36.7 C)     Temp src --      SpO2 06/05/16 1129 98 %     Weight 06/05/16 1127 293 lb (132.9 kg)     Height 06/05/16 1127 5\' 1"  (1.549 m)     Head Circumference --      Peak Flow --      Pain Score 06/05/16 1127 8     Pain Loc --      Pain Edu? --      Excl. in GC? --     Constitutional: Alert and oriented. No acute distress. Pleasant and interactive Eyes: Conjunctivae are normal.  Head: Atraumatic. Nose: No congestion/rhinnorhea. Mouth/Throat: Mucous  membranes are moist.   Neck:  Painless ROM Cardiovascular: Normal rate, regular rhythm. Grossly normal heart sounds.  Good peripheral circulation.Mild tenderness just lateral to the central sternum on the right Respiratory: Normal respiratory effort.  No retractions. Lungs CTAB. Gastrointestinal: Soft and nontender. No distention.  No CVA tenderness. Genitourinary: deferred Musculoskeletal: No lower extremity tenderness nor edema.  Warm and well perfused Neurologic:  Normal speech and language. No gross focal neurologic deficits are appreciated.  Skin:  Skin is warm, dry and intact. No rash  noted. Psychiatric: Mood and affect are normal. Speech and behavior are normal.  ____________________________________________   LABS (all labs ordered are listed, but only abnormal results are displayed)  Labs Reviewed  BASIC METABOLIC PANEL  CBC  TROPONIN I  TROPONIN I   ____________________________________________  EKG  ED ECG REPORT I, Jene EveryKINNER, Tamari Redwine, the attending physician, personally viewed and interpreted this ECG.  Date: 06/05/2016 EKG Time: 11:34 AM Rate: 73 Rhythm: normal sinus rhythm QRS Axis: normal Intervals: normal ST/T Wave abnormalities: normal Conduction Disturbances: none Narrative Interpretation: unremarkable  ____________________________________________  RADIOLOGY  Chest x-ray unremarkable ____________________________________________   PROCEDURES  Procedure(s) performed: No    Critical Care performed: No ____________________________________________   INITIAL IMPRESSION / ASSESSMENT AND PLAN / ED COURSE  Pertinent labs & imaging results that were available during my care of the patient were reviewed by me and considered in my medical decision making (see chart for details).  Patient presents with chest pain, she is morbidly obese and also has diabetes. Lab work is unremarkable. EKG is benign. Chest x-ray is benign. Given her medical history we will send a second troponin although my suspicion for ACS is very low.   Clinical Course  Second troponin normal. Patient remains comfortable in no distress. Discharge with PCP follow-up ____________________________________________   FINAL CLINICAL IMPRESSION(S) / ED DIAGNOSES  Final diagnoses:  Atypical chest pain      NEW MEDICATIONS STARTED DURING THIS VISIT:  New Prescriptions   No medications on file     Note:  This document was prepared using Dragon voice recognition software and may include unintentional dictation errors.    Jene Everyobert Waleska Buttery, MD 06/05/16 1447

## 2016-06-05 NOTE — ED Triage Notes (Signed)
Chest pain onset this morning.  Patient states she has not been feeling well for the past two days.  Also c/o dizziness.

## 2016-09-04 ENCOUNTER — Encounter: Payer: Self-pay | Admitting: Emergency Medicine

## 2016-09-04 ENCOUNTER — Emergency Department
Admission: EM | Admit: 2016-09-04 | Discharge: 2016-09-04 | Disposition: A | Discharge status: Home / Self Care | Payer: Medicaid Other | Attending: Emergency Medicine | Admitting: Emergency Medicine

## 2016-09-04 DIAGNOSIS — Z791 Long term (current) use of non-steroidal anti-inflammatories (NSAID): Secondary | ICD-10-CM | POA: Insufficient documentation

## 2016-09-04 DIAGNOSIS — E119 Type 2 diabetes mellitus without complications: Secondary | ICD-10-CM | POA: Insufficient documentation

## 2016-09-04 DIAGNOSIS — H6692 Otitis media, unspecified, left ear: Secondary | ICD-10-CM | POA: Insufficient documentation

## 2016-09-04 DIAGNOSIS — I10 Essential (primary) hypertension: Secondary | ICD-10-CM | POA: Insufficient documentation

## 2016-09-04 DIAGNOSIS — H9202 Otalgia, left ear: Secondary | ICD-10-CM

## 2016-09-04 MED ORDER — TRAMADOL HCL 50 MG PO TABS: 50.0000 mg | ORAL_TABLET | Freq: Four times a day (QID) | ORAL | 0 refills | Status: DC | PRN

## 2016-09-04 MED ORDER — NEOMYCIN-POLYMYXIN-HC 3.5-10000-1 OT SOLN: 3.0000 [drp] | Freq: Three times a day (TID) | OTIC | 0 refills | Status: AC

## 2016-09-04 NOTE — Discharge Instructions (Signed)
Use eardrops as directed

## 2016-09-04 NOTE — ED Triage Notes (Signed)
Developed pain to left ear and into neck 2 days ago    No fever

## 2016-09-04 NOTE — ED Provider Notes (Signed)
[facility] Emergency Department Provider Note   ____________________________________________   First MD Initiated Contact with Patient 09/04/16 1114     (approximate)  I have reviewed the triage vital signs and the nursing notes.   HISTORY  Chief Complaint Otalgia    HPI Bonnie Duncan is a 20 y.o. female patient complaining of left ear pain for 2 days. Patient denies any hearing loss associated this complaint. Patient state she's has some mild URI signs and symptoms for about one week. Patient rates the pain as a 7/10. Patient described a pain as "achy". No palliative measures taken for this complaint.   Past Medical History:  Diagnosis Date  . Diabetes mellitus without complication [facility](HCC)     Patient Active Problem List   Diagnosis Date Noted  . Obesity 02/04/2011  . Pre-diabetes 02/04/2011  . Essential hypertension, benign 02/04/2011  . Acquired acanthosis nigricans 02/04/2011    History reviewed. No pertinent surgical history.  Prior to Admission medications   Medication Sig Start Date End Date Taking? Authorizing Provider  cephALEXin (KEFLEX) 500 MG capsule Take 1 capsule (500 mg total) by mouth 3 (three) times daily. 05/31/16   Minna AntisKevin Paduchowski, MD  ibuprofen (ADVIL,MOTRIN) 800 MG tablet Take 1 tablet (800 mg total) by mouth every 8 (eight) hours as needed. 05/31/16   Minna AntisKevin Paduchowski, MD  naproxen (NAPROSYN) 500 MG tablet Take 1 tablet (500 mg total) by mouth 2 (two) times daily with a meal. Patient not taking: Reported on 05/31/2016 04/12/16 04/12/17  Gayla DossEryka A Gayle, MD  ondansetron (ZOFRAN ODT) 4 MG disintegrating tablet Take 1 tablet (4 mg total) by mouth every 8 (eight) hours as needed for nausea or vomiting. Patient not taking: Reported on 05/31/2016 06/14/15   Darci Currentandolph N Brown, MD  oxyCODONE-acetaminophen (ROXICET) 5-325 MG per tablet Take 1 tablet by mouth every 4 (four) hours as needed for severe pain. Patient not taking: Reported on  05/31/2016 06/14/15   Darci Currentandolph N Brown, MD    Allergies Patient has no known allergies.  No family history on file.  Social History Social History  Substance Use Topics  . Smoking status: Never Smoker  . Smokeless tobacco: Never Used  . Alcohol use No    Review of Systems Constitutional: No fever/chills Eyes: No visual changes. ENT: No sore throat. Cardiovascular: Denies chest pain. Respiratory: Denies shortness of breath. Gastrointestinal: No abdominal pain.  No nausea, no vomiting.  No diarrhea.  No constipation. Genitourinary: Negative for dysuria. Musculoskeletal: Negative for back pain. Skin: Negative for rash. Neurological: Negative for headaches, focal weakness or numbness. Endocrine:Diabetes and hypertension  ____________________________________________   PHYSICAL EXAM:  VITAL SIGNS: ED Triage Vitals [09/04/16 1102]  Enc Vitals Group     BP      Pulse      Resp      Temp      Temp src      SpO2      Weight      Height      Head Circumference      Peak Flow      Pain Score 7     Pain Loc      Pain Edu?      Excl. in GC?     Constitutional: Alert and oriented. Well appearing and in no acute distress. Morbid obesity Eyes: Conjunctivae are normal. PERRL. EOMI. Head: Atraumatic. Nose: No congestion/rhinnorhea. EARS: Edematous left ear canal. Right ear unremarkable. Mouth/Throat: Mucous membranes are moist.  Oropharynx non-erythematous. Neck:  No stridor.  No cervical spine tenderness to palpation. Hematological/Lymphatic/Immunilogical: No cervical lymphadenopathy. Cardiovascular: Normal rate, regular rhythm. Grossly normal heart sounds.  Good peripheral circulation. Respiratory: Normal respiratory effort.  No retractions. Lungs CTAB. Gastrointestinal: Soft and nontender. No distention. No abdominal bruits. No CVA tenderness. Musculoskeletal: No lower extremity tenderness nor edema.  No joint effusions. Neurologic:  Normal speech and language. No gross  focal neurologic deficits are appreciated. No gait instability. Skin:  Skin is warm, dry and intact. No rash noted. Psychiatric: Mood and affect are normal. Speech and behavior are normal.  ____________________________________________   LABS (all labs ordered are listed, but only abnormal results are displayed)  Labs Reviewed - No data to display ____________________________________________  EKG   ____________________________________________  RADIOLOGY   ____________________________________________   PROCEDURES  Procedure(s) performed: None  Procedures  Critical Care performed: No  ____________________________________________   INITIAL IMPRESSION / ASSESSMENT AND PLAN / ED COURSE  Pertinent labs & imaging results that were available during my care of the patient were reviewed by me and considered in my medical decision making (see chart for details).  Left otitis external. Patient given discharge care instructions. Patient given prescription for Cortisporin and tramadol. Advised follow-up family doctor no improvement 3-5 days.  Clinical Course      ____________________________________________   FINAL CLINICAL IMPRESSION(S) / ED DIAGNOSES  Final diagnoses:  Otalgia of left ear      NEW MEDICATIONS STARTED DURING THIS VISIT:  New Prescriptions   No medications on file     Note:  This document was prepared using Dragon voice recognition software and may include unintentional dictation errors.    Joni Reiningonald K Clarabel Marion, PA-C 09/04/16 1130    Emily FilbertJonathan E Williams, MD 09/04/16 1256

## 2017-04-08 ENCOUNTER — Emergency Department
Admission: EM | Admit: 2017-04-08 | Discharge: 2017-04-08 | Disposition: A | Discharge status: Home / Self Care | Payer: Medicaid Other | Attending: Student in an Organized Health Care Education/Training Program | Admitting: Student in an Organized Health Care Education/Training Program

## 2017-04-08 ENCOUNTER — Encounter: Payer: Self-pay | Admitting: Emergency Medicine

## 2017-04-08 ENCOUNTER — Emergency Department: Payer: Medicaid Other

## 2017-04-08 DIAGNOSIS — O26899 Other specified pregnancy related conditions, unspecified trimester: Secondary | ICD-10-CM | POA: Insufficient documentation

## 2017-04-08 DIAGNOSIS — O26891 Other specified pregnancy related conditions, first trimester: Secondary | ICD-10-CM

## 2017-04-08 DIAGNOSIS — E119 Type 2 diabetes mellitus without complications: Secondary | ICD-10-CM | POA: Insufficient documentation

## 2017-04-08 DIAGNOSIS — F17210 Nicotine dependence, cigarettes, uncomplicated: Secondary | ICD-10-CM | POA: Diagnosis not present

## 2017-04-08 DIAGNOSIS — Z3A01 Less than 8 weeks gestation of pregnancy: Secondary | ICD-10-CM | POA: Insufficient documentation

## 2017-04-08 DIAGNOSIS — R109 Unspecified abdominal pain: Secondary | ICD-10-CM | POA: Diagnosis present

## 2017-04-08 DIAGNOSIS — R102 Pelvic and perineal pain: Secondary | ICD-10-CM

## 2017-04-08 LAB — COMPREHENSIVE METABOLIC PANEL
ALBUMIN: 3.9 g/dL (ref 3.5–5.0)
ALK PHOS: 52 U/L (ref 38–126)
ALT: 15 U/L (ref 14–54)
AST: 20 U/L (ref 15–41)
Anion gap: 6 (ref 5–15)
BUN: 14 mg/dL (ref 6–20)
CALCIUM: 8.6 mg/dL — AB (ref 8.9–10.3)
CO2: 22 mmol/L (ref 22–32)
CREATININE: 0.39 mg/dL — AB (ref 0.44–1.00)
Chloride: 105 mmol/L (ref 101–111)
GFR calc Af Amer: >60 mL/min (ref >60)
GFR calc non Af Amer: >60 mL/min (ref >60)
GLUCOSE: 123 mg/dL — AB (ref 65–99)
Potassium: 3.5 mmol/L (ref 3.5–5.1)
SODIUM: 133 mmol/L — AB (ref 135–145)
TOTAL PROTEIN: 7.2 g/dL (ref 6.5–8.1)
Total Bilirubin: 0.6 mg/dL (ref 0.3–1.2)

## 2017-04-08 LAB — URINALYSIS, COMPLETE (UACMP) WITH MICROSCOPIC
BILIRUBIN URINE: NEGATIVE
Glucose, UA: NEGATIVE mg/dL
HGB URINE DIPSTICK: NEGATIVE
KETONES UR: NEGATIVE mg/dL
LEUKOCYTES UA: NEGATIVE
NITRITE: NEGATIVE
PH: 7 (ref 5.0–8.0)
Protein, ur: NEGATIVE mg/dL
SPECIFIC GRAVITY, URINE: 1.02 (ref 1.005–1.030)

## 2017-04-08 LAB — CBC
HEMATOCRIT: 37.9 % (ref 35.0–47.0)
Hemoglobin: 12.7 g/dL (ref 12.0–16.0)
MCH: 28.9 pg (ref 26.0–34.0)
MCHC: 33.5 g/dL (ref 32.0–36.0)
MCV: 86.2 fL (ref 80.0–100.0)
Platelets: 271 10*3/uL (ref 150–440)
RBC: 4.4 MIL/uL (ref 3.80–5.20)
RDW: 13.7 % (ref 11.5–14.5)
WBC: 9.8 10*3/uL (ref 3.6–11.0)

## 2017-04-08 LAB — LIPASE, BLOOD: Lipase: 24 U/L (ref 11–51)

## 2017-04-08 LAB — POCT PREGNANCY, URINE: Preg Test, Ur: POSITIVE — AB

## 2017-04-08 LAB — HCG, QUANTITATIVE, PREGNANCY: hCG, Beta Chain, Quant, S: 10427 m[IU]/mL — ABNORMAL HIGH (ref <5)

## 2017-04-08 MED ORDER — NITROFURANTOIN MONOHYD MACRO 100 MG PO CAPS: 100.0000 mg | ORAL_CAPSULE | Freq: Two times a day (BID) | ORAL | 0 refills | Status: AC

## 2017-04-08 MED ORDER — DOXYLAMINE-PYRIDOXINE 10-10 MG PO TBEC: 1.0000 | DELAYED_RELEASE_TABLET | Freq: Two times a day (BID) | ORAL | 0 refills | Status: DC

## 2017-04-08 NOTE — ED Triage Notes (Signed)
Patient reports abdominal cramping "like I'm going to start my period" for approx a month. Denies vaginal bleeding or discharge. Patient reports her last period was at the beginning of April. States she has not taken a pregnancy test at home. Patient also states she has had increased nausea. A& O x4.

## 2017-04-08 NOTE — ED Provider Notes (Signed)
[facility] Emergency Department Provider Note    First MD Initiated Contact with Patient 04/08/17 1407     (approximate)  I have reviewed the triage vital signs and the nursing notes.   HISTORY  Chief Complaint Abdominal Pain    HPI Bonnie Duncan is a 21 y.o. female history of diabetes presents with 1 month of abdominal cramping that is bilateral and feels like she is about to start her menstrual cycle. Last menstrual cycle was on April 15. Patient is sexually active. She denies any vaginal bleeding or vaginal discharge. No back pain. No dysuria. Has not taken any home pregnancy tests. Denies any nausea vomiting or epigastric pain.   Past Medical History:  Diagnosis Date  . Diabetes mellitus without complication (HCC)    No family history on file. History reviewed. No pertinent surgical history. Patient Active Problem List   Diagnosis Date Noted  . Obesity 02/04/2011  . Pre-diabetes 02/04/2011  . Essential hypertension, benign 02/04/2011  . Acquired acanthosis nigricans 02/04/2011      Prior to Admission medications   Medication Sig Start Date End Date Taking? Authorizing Provider  cephALEXin (KEFLEX) 500 MG capsule Take 1 capsule (500 mg total) by mouth 3 (three) times daily. 05/31/16   Minna Antis, MD  Doxylamine-Pyridoxine 10-10 MG TBEC Take 1 tablet by mouth 2 (two) times daily. 04/08/17   Willy Eddy, MD  ibuprofen (ADVIL,MOTRIN) 800 MG tablet Take 1 tablet (800 mg total) by mouth every 8 (eight) hours as needed. 05/31/16   Minna Antis, MD  naproxen (NAPROSYN) 500 MG tablet Take 1 tablet (500 mg total) by mouth 2 (two) times daily with a meal. Patient not taking: Reported on 05/31/2016 04/12/16 04/12/17  Gayla Doss, MD  nitrofurantoin, macrocrystal-monohydrate, (MACROBID) 100 MG capsule Take 1 capsule (100 mg total) by mouth 2 (two) times daily. 04/08/17 04/13/17  Willy Eddy, MD  ondansetron (ZOFRAN ODT) 4 MG  disintegrating tablet Take 1 tablet (4 mg total) by mouth every 8 (eight) hours as needed for nausea or vomiting. Patient not taking: Reported on 05/31/2016 06/14/15   Darci Current, MD  oxyCODONE-acetaminophen (ROXICET) 5-325 MG per tablet Take 1 tablet by mouth every 4 (four) hours as needed for severe pain. Patient not taking: Reported on 05/31/2016 06/14/15   Darci Current, MD  traMADol (ULTRAM) 50 MG tablet Take 1 tablet (50 mg total) by mouth every 6 (six) hours as needed for moderate pain. 09/04/16   Joni Reining, PA-C    Allergies Acetaminophen    Social History Social History  Substance Use Topics  . Smoking status: Current Every Day Smoker    Packs/day: 0.50    Types: Cigarettes  . Smokeless tobacco: Never Used  . Alcohol use No    Review of Systems Patient denies headaches, rhinorrhea, blurry vision, numbness, shortness of breath, chest pain, edema, cough, abdominal pain, nausea, vomiting, diarrhea, dysuria, fevers, rashes or hallucinations unless otherwise stated above in HPI. ____________________________________________   PHYSICAL EXAM:  VITAL SIGNS: There were no vitals filed for this visit.  Constitutional: Alert and oriented. Morbidly obese, Well appearing and in no acute distress. Eyes: Conjunctivae are normal.  Head: Atraumatic. Nose: No congestion/rhinnorhea. Mouth/Throat: Mucous membranes are moist.   Neck: No stridor. Painless ROM.  Cardiovascular: Normal rate, regular rhythm. Grossly normal heart sounds.  Good peripheral circulation. Respiratory: Normal respiratory effort.  No retractions. Lungs CTAB. Gastrointestinal: Soft and nontender. No distention. No abdominal bruits. No CVA tenderness.  Musculoskeletal:  No lower extremity tenderness nor edema.  No joint effusions. Neurologic:  Normal speech and language. No gross focal neurologic deficits are appreciated. No facial droop Skin:  Skin is warm, dry and intact. No rash noted. Psychiatric:  Mood and affect are normal. Speech and behavior are normal.  ____________________________________________   LABS (all labs ordered are listed, but only abnormal results are displayed)  Results for orders placed or performed during the hospital encounter of 04/08/17 (from the past 24 hour(s))  Lipase, blood     Status: None   Collection Time: 04/08/17  1:35 PM  Result Value Ref Range   Lipase 24 11 - 51 U/L  Comprehensive metabolic panel     Status: Abnormal   Collection Time: 04/08/17  1:35 PM  Result Value Ref Range   Sodium 133 (L) 135 - 145 mmol/L   Potassium 3.5 3.5 - 5.1 mmol/L   Chloride 105 101 - 111 mmol/L   CO2 22 22 - 32 mmol/L   Glucose, Bld 123 (H) 65 - 99 mg/dL   BUN 14 6 - 20 mg/dL   Creatinine, Ser 4.09 (L) 0.44 - 1.00 mg/dL   Calcium 8.6 (L) 8.9 - 10.3 mg/dL   Total Protein 7.2 6.5 - 8.1 g/dL   Albumin 3.9 3.5 - 5.0 g/dL   AST 20 15 - 41 U/L   ALT 15 14 - 54 U/L   Alkaline Phosphatase 52 38 - 126 U/L   Total Bilirubin 0.6 0.3 - 1.2 mg/dL   GFR calc non Af Amer >60 >60 mL/min   GFR calc Af Amer >60 >60 mL/min   Anion gap 6 5 - 15  CBC     Status: None   Collection Time: 04/08/17  1:35 PM  Result Value Ref Range   WBC 9.8 3.6 - 11.0 K/uL   RBC 4.40 3.80 - 5.20 MIL/uL   Hemoglobin 12.7 12.0 - 16.0 g/dL   HCT 81.1 91.4 - 78.2 %   MCV 86.2 80.0 - 100.0 fL   MCH 28.9 26.0 - 34.0 pg   MCHC 33.5 32.0 - 36.0 g/dL   RDW 95.6 21.3 - 08.6 %   Platelets 271 150 - 440 K/uL  Urinalysis, Complete w Microscopic     Status: Abnormal   Collection Time: 04/08/17  1:35 PM  Result Value Ref Range   Color, Urine YELLOW YELLOW   APPearance CLEAR CLEAR   Specific Gravity, Urine 1.020 1.005 - 1.030   pH 7.0 5.0 - 8.0   Glucose, UA NEGATIVE NEGATIVE mg/dL   Hgb urine dipstick NEGATIVE NEGATIVE   Bilirubin Urine NEGATIVE NEGATIVE   Ketones, ur NEGATIVE NEGATIVE mg/dL   Protein, ur NEGATIVE NEGATIVE mg/dL   Nitrite NEGATIVE NEGATIVE   Leukocytes, UA NEGATIVE NEGATIVE    Squamous Epithelial / LPF 6-30 (A) NONE SEEN   WBC, UA 6-30 0 - 5 WBC/hpf   RBC / HPF 0-5 0 - 5 RBC/hpf   Bacteria, UA RARE (A) NONE SEEN   Mucous PRESENT   hCG, quantitative, pregnancy     Status: Abnormal   Collection Time: 04/08/17  1:35 PM  Result Value Ref Range   hCG, Beta Chain, Quant, S 10,427 (H) <5 mIU/mL  Pregnancy, urine POC     Status: Abnormal   Collection Time: 04/08/17  1:44 PM  Result Value Ref Range   Preg Test, Ur POSITIVE (A) NEGATIVE   ____________________________________________  ____________________________________________  RADIOLOGY  I personally reviewed all radiographic images ordered to evaluate for the  above acute complaints and reviewed radiology reports and findings.  These findings were personally discussed with the patient.  Please see medical record for radiology report.  ____________________________________________   PROCEDURES  Procedure(s) performed:  Procedures    Critical Care performed: 4no ____________________________________________   INITIAL IMPRESSION / ASSESSMENT AND PLAN / ED COURSE  Pertinent labs & imaging results that were available during my care of the patient were reviewed by me and considered in my medical decision making (see chart for details).  DDX: ectopic, iup, round ligament pain, uti  Bonnie Duncan is a 21 y.o. who presents to the ED with abdominal cramping in early pregnancy. Ultrasound ordered to evaluate for ectopic shows normal intrauterine pregnancy roughly [redacted] weeks gestational age. Patient with reassuring blood work. Abdominal exam obese but otherwise soft and benign. Does have 4 bacteria and mild stenosis on urine therefore we will treat empirically for urinary tract infection. Patient tolerating oral hydration.  Have discussed with the patient and available family all diagnostics and treatments performed thus far and all questions were answered to the best of my ability. The patient demonstrates  understanding and agreement with plan.       ____________________________________________   FINAL CLINICAL IMPRESSION(S) / ED DIAGNOSES  Final diagnoses:  Less than [redacted] weeks gestation of pregnancy  Pelvic pain affecting pregnancy in first trimester, antepartum      NEW MEDICATIONS STARTED DURING THIS VISIT:  New Prescriptions   DOXYLAMINE-PYRIDOXINE 10-10 MG TBEC    Take 1 tablet by mouth 2 (two) times daily.   NITROFURANTOIN, MACROCRYSTAL-MONOHYDRATE, (MACROBID) 100 MG CAPSULE    Take 1 capsule (100 mg total) by mouth 2 (two) times daily.     Note:  This document was prepared using Dragon voice recognition software and may include unintentional dictation errors.    Willy Eddyobinson, Micayla Brathwaite, MD 04/08/17 [phone]

## 2017-04-08 NOTE — ED Notes (Signed)
Patient ambulatory to lobby. NAD noted.  

## 2017-12-07 ENCOUNTER — Emergency Department
Admission: EM | Admit: 2017-12-07 | Discharge: 2017-12-07 | Disposition: A | Discharge status: Home / Self Care | Payer: Medicaid Other | Attending: Emergency Medicine | Admitting: Emergency Medicine

## 2017-12-07 ENCOUNTER — Encounter: Payer: Self-pay | Admitting: Intensive Care

## 2017-12-07 ENCOUNTER — Emergency Department: Payer: Medicaid Other

## 2017-12-07 DIAGNOSIS — I10 Essential (primary) hypertension: Secondary | ICD-10-CM | POA: Diagnosis not present

## 2017-12-07 DIAGNOSIS — N939 Abnormal uterine and vaginal bleeding, unspecified: Secondary | ICD-10-CM

## 2017-12-07 DIAGNOSIS — R1030 Lower abdominal pain, unspecified: Secondary | ICD-10-CM | POA: Diagnosis present

## 2017-12-07 DIAGNOSIS — E119 Type 2 diabetes mellitus without complications: Secondary | ICD-10-CM | POA: Diagnosis not present

## 2017-12-07 DIAGNOSIS — R109 Unspecified abdominal pain: Secondary | ICD-10-CM

## 2017-12-07 DIAGNOSIS — F17210 Nicotine dependence, cigarettes, uncomplicated: Secondary | ICD-10-CM | POA: Insufficient documentation

## 2017-12-07 LAB — CBC
HCT: 38.2 % (ref 35.0–47.0)
HEMOGLOBIN: 12.5 g/dL (ref 12.0–16.0)
MCH: 28.1 pg (ref 26.0–34.0)
MCHC: 32.7 g/dL (ref 32.0–36.0)
MCV: 85.9 fL (ref 80.0–100.0)
PLATELETS: 282 10*3/uL (ref 150–440)
RBC: 4.44 MIL/uL (ref 3.80–5.20)
RDW: 14.2 % (ref 11.5–14.5)
WBC: 9.5 10*3/uL (ref 3.6–11.0)

## 2017-12-07 LAB — COMPREHENSIVE METABOLIC PANEL
ALBUMIN: 4 g/dL (ref 3.5–5.0)
ALT: 15 U/L (ref 14–54)
AST: 33 U/L (ref 15–41)
Alkaline Phosphatase: 64 U/L (ref 38–126)
Anion gap: 9 (ref 5–15)
BUN: 9 mg/dL (ref 6–20)
CHLORIDE: 108 mmol/L (ref 101–111)
CO2: 22 mmol/L (ref 22–32)
CREATININE: 0.72 mg/dL (ref 0.44–1.00)
Calcium: 8.8 mg/dL — ABNORMAL LOW (ref 8.9–10.3)
GFR calc Af Amer: >60 mL/min (ref >60)
GLUCOSE: 98 mg/dL (ref 65–99)
POTASSIUM: 4.2 mmol/L (ref 3.5–5.1)
Sodium: 139 mmol/L (ref 135–145)
Total Bilirubin: 0.8 mg/dL (ref 0.3–1.2)
Total Protein: 7.4 g/dL (ref 6.5–8.1)

## 2017-12-07 LAB — URINALYSIS, COMPLETE (UACMP) WITH MICROSCOPIC
BACTERIA UA: NONE SEEN
Bilirubin Urine: NEGATIVE
Glucose, UA: NEGATIVE mg/dL
Ketones, ur: NEGATIVE mg/dL
Leukocytes, UA: NEGATIVE
Nitrite: NEGATIVE
PROTEIN: NEGATIVE mg/dL
SPECIFIC GRAVITY, URINE: 1.006 (ref 1.005–1.030)
pH: 6 (ref 5.0–8.0)

## 2017-12-07 LAB — PREGNANCY, URINE: Preg Test, Ur: NEGATIVE

## 2017-12-07 LAB — LIPASE, BLOOD: LIPASE: 30 U/L (ref 11–51)

## 2017-12-07 LAB — POCT PREGNANCY, URINE: PREG TEST UR: NEGATIVE

## 2017-12-07 LAB — HCG, QUANTITATIVE, PREGNANCY: HCG, BETA CHAIN, QUANT, S: 1 m[IU]/mL (ref <5)

## 2017-12-07 MED ORDER — DICYCLOMINE HCL 20 MG PO TABS: 20.0000 mg | ORAL_TABLET | Freq: Three times a day (TID) | ORAL | 0 refills | Status: DC | PRN

## 2017-12-07 NOTE — Discharge Instructions (Signed)
Please seek medical attention for any high fevers, chest pain, shortness of breath, change in behavior, persistent vomiting, bloody stool or any other new or concerning symptoms.  

## 2017-12-07 NOTE — ED Provider Notes (Signed)
[facility] Emergency Department Provider Note   ____________________________________________   I have reviewed the triage vital signs and the nursing notes.   HISTORY  Chief Complaint Stomach pain, vaginal bleeding  History limited by: Not Limited   HPI Bonnie Duncan is a 22 y.o. female who presents to the emergency department today with primary concern for abdominal pain and vaginal bleeding. The patient states that these symptoms started this afternoon. They started suddenly. The abdominal pain is located in the upper and lower abdomin. By the time of my exam they had eased off. The pain was also accompanied by vaginal bleeding. Patient states she had her last period 8 days ago.   Per medical record review patient has a history of DM  Past Medical History:  Diagnosis Date  . Diabetes mellitus without complication [facility])     Patient Active Problem List   Diagnosis Date Noted  . Obesity 02/04/2011  . Pre-diabetes 02/04/2011  . Essential hypertension, benign 02/04/2011  . Acquired acanthosis nigricans 02/04/2011    History reviewed. No pertinent surgical history.  Prior to Admission medications   Medication Sig Start Date End Date Taking? Authorizing Provider  cephALEXin (KEFLEX) 500 MG capsule Take 1 capsule (500 mg total) by mouth 3 (three) times daily. 05/31/16   Minna Antis, MD  Doxylamine-Pyridoxine 10-10 MG TBEC Take 1 tablet by mouth 2 (two) times daily. 04/08/17   Willy Eddy, MD  ibuprofen (ADVIL,MOTRIN) 800 MG tablet Take 1 tablet (800 mg total) by mouth every 8 (eight) hours as needed. 05/31/16   Minna Antis, MD  ondansetron (ZOFRAN ODT) 4 MG disintegrating tablet Take 1 tablet (4 mg total) by mouth every 8 (eight) hours as needed for nausea or vomiting. Patient not taking: Reported on 05/31/2016 06/14/15   Darci Current, MD  oxyCODONE-acetaminophen (ROXICET) 5-325 MG per tablet Take 1 tablet by mouth every 4 (four) hours  as needed for severe pain. Patient not taking: Reported on 05/31/2016 06/14/15   Darci Current, MD  traMADol (ULTRAM) 50 MG tablet Take 1 tablet (50 mg total) by mouth every 6 (six) hours as needed for moderate pain. 09/04/16   Joni Reining, PA-C    Allergies Acetaminophen  History reviewed. No pertinent family history.  Social History Social History   Tobacco Use  . Smoking status: Current Every Day Smoker    Packs/day: 0.50    Types: Cigarettes  . Smokeless tobacco: Never Used  Substance Use Topics  . Alcohol use: No  . Drug use: No    Review of Systems Constitutional: No fever/chills Eyes: No visual changes. ENT: No sore throat. Cardiovascular: Denies chest pain. Respiratory: Denies shortness of breath. Gastrointestinal: Positive for abdominal pain.  Genitourinary: Positive for vaginal bleeding. Musculoskeletal: Negative for back pain. Skin: Negative for rash. Neurological: Negative for headaches, focal weakness or numbness.  ____________________________________________   PHYSICAL EXAM:  VITAL SIGNS: ED Triage Vitals [12/07/17 1814]  Enc Vitals Group     BP (!) 156/85     Pulse Rate 88     Resp 18     Temp 98.3 F (36.8 C)     Temp Source Oral     SpO2 100 %     Weight (!) 340 lb (154.2 kg)     Height 5\' 3"  (1.6 m)     Head Circumference      Peak Flow      Pain Score 10   Constitutional: Alert and oriented. Well appearing and in  no distress. Eyes: Conjunctivae are normal.  ENT   Head: Normocephalic and atraumatic.   Nose: No congestion/rhinnorhea.   Mouth/Throat: Mucous membranes are moist.   Neck: No stridor. Hematological/Lymphatic/Immunilogical: No cervical lymphadenopathy. Cardiovascular: Normal rate, regular rhythm.  No murmurs, rubs, or gallops.  Respiratory: Normal respiratory effort without tachypnea nor retractions. Breath sounds are clear and equal bilaterally. No wheezes/rales/rhonchi. Gastrointestinal: Soft and  diffusely minimally tender to palpation. No rebound. No guarding.  Genitourinary: Deferred Musculoskeletal: Normal range of motion in all extremities. No lower extremity edema. Neurologic:  Normal speech and language. No gross focal neurologic deficits are appreciated.  Skin:  Skin is warm, dry and intact. No rash noted. Psychiatric: Mood and affect are normal. Speech and behavior are normal. Patient exhibits appropriate insight and judgment.  ____________________________________________    LABS (pertinent positives/negatives)  POCT urine negative CBC wnl UA RBC too numerous to count, wbc 6030, negative leukocytes, negative nitrite Lipase 30 CMP k 4.2, cr 0.72, glu 98 ____________________________________________   EKG  None  ____________________________________________    RADIOLOGY  US  No evidence for retained products of conception   ____________________________________________   PROCEDURES  Procedures  ____________________________________________   INITIAL IMPRESSION / ASSESSMENT AND PLAN / ED COURSE  Pertinent labs & imaging results that were available during my care of the patient were reviewed by me and considered in my medical decision making (see chart for details).  Patient presented to the emergency department today with concerns for abdominal pain vaginal bleeding.  Blood work without any concerning leukocytosis, lipase elevation or liver function test elevation.  The patient did undergo SVD of IUFD a couple of months ago. US did not show any concerning findings. Did discuss US results with patient. Discussed importance of follow up with ob.gyn   ____________________________________________   FINAL CLINICAL IMPRESSION(S) / ED DIAGNOSES  Final diagnoses:  Abdominal pain, unspecified abdominal location  Abnormal vaginal bleeding     Note: This dictation was prepared with Dragon dictation. Any transcriptional errors that result from this process  are unintentional     Phineas SemenGoodman, Rayvon Brandvold, MD 12/07/17 2122

## 2017-12-07 NOTE — ED Triage Notes (Signed)
FIRST NURSE NOTE-pt had to deliver still born baby at 33 weeks 2/1.  Pt having severe lower abdominal cramping. [facility] Appears in pain.  Next for triage.

## 2017-12-07 NOTE — ED Triage Notes (Signed)
Patient has stress test on February 27th and told her baby had no heartbeat at 6133 1/2. Induced patient and she had baby on January 1st. Patient is in triage crying, bleeding heavy and having contractions that started today. Patient and mom reports everything went smoohtly with delivery and was fine until today.

## 2017-12-07 NOTE — ED Notes (Signed)
Pt started having some abdominal pain today around 4:30pm along with cramping and bleeding. Bleeding has slowed some. Family at bedside.

## 2017-12-21 DIAGNOSIS — E119 Type 2 diabetes mellitus without complications: Secondary | ICD-10-CM | POA: Diagnosis not present

## 2017-12-21 DIAGNOSIS — R1012 Left upper quadrant pain: Secondary | ICD-10-CM | POA: Insufficient documentation

## 2017-12-21 DIAGNOSIS — Z79899 Other long term (current) drug therapy: Secondary | ICD-10-CM | POA: Insufficient documentation

## 2017-12-21 DIAGNOSIS — F17210 Nicotine dependence, cigarettes, uncomplicated: Secondary | ICD-10-CM | POA: Insufficient documentation

## 2017-12-21 DIAGNOSIS — Z5329 Procedure and treatment not carried out because of patient's decision for other reasons: Secondary | ICD-10-CM | POA: Insufficient documentation

## 2017-12-21 DIAGNOSIS — I10 Essential (primary) hypertension: Secondary | ICD-10-CM | POA: Insufficient documentation

## 2017-12-21 LAB — CBC
HEMATOCRIT: 36.8 % (ref 35.0–47.0)
HEMOGLOBIN: 11.7 g/dL — AB (ref 12.0–16.0)
MCH: 27.3 pg (ref 26.0–34.0)
MCHC: 31.9 g/dL — AB (ref 32.0–36.0)
MCV: 85.5 fL (ref 80.0–100.0)
Platelets: 298 10*3/uL (ref 150–440)
RBC: 4.3 MIL/uL (ref 3.80–5.20)
RDW: 14.6 % — ABNORMAL HIGH (ref 11.5–14.5)
WBC: 8.7 10*3/uL (ref 3.6–11.0)

## 2017-12-21 LAB — COMPREHENSIVE METABOLIC PANEL
ALK PHOS: 58 U/L (ref 38–126)
ALT: 18 U/L (ref 14–54)
ANION GAP: 7 (ref 5–15)
AST: 19 U/L (ref 15–41)
Albumin: 3.8 g/dL (ref 3.5–5.0)
BUN: 11 mg/dL (ref 6–20)
CALCIUM: 8.2 mg/dL — AB (ref 8.9–10.3)
CO2: 24 mmol/L (ref 22–32)
Chloride: 106 mmol/L (ref 101–111)
Creatinine, Ser: 0.61 mg/dL (ref 0.44–1.00)
GFR calc non Af Amer: >60 mL/min (ref >60)
GLUCOSE: 86 mg/dL (ref 65–99)
POTASSIUM: 3.9 mmol/L (ref 3.5–5.1)
Sodium: 137 mmol/L (ref 135–145)
TOTAL PROTEIN: 6.8 g/dL (ref 6.5–8.1)
Total Bilirubin: 0.6 mg/dL (ref 0.3–1.2)

## 2017-12-21 LAB — URINALYSIS, COMPLETE (UACMP) WITH MICROSCOPIC
BACTERIA UA: NONE SEEN
BILIRUBIN URINE: NEGATIVE
Glucose, UA: NEGATIVE mg/dL
Hgb urine dipstick: NEGATIVE
KETONES UR: NEGATIVE mg/dL
Leukocytes, UA: NEGATIVE
NITRITE: NEGATIVE
PROTEIN: NEGATIVE mg/dL
Specific Gravity, Urine: 1.01 (ref 1.005–1.030)
pH: 6 (ref 5.0–8.0)

## 2017-12-21 LAB — LIPASE, BLOOD: Lipase: 26 U/L (ref 11–51)

## 2017-12-21 LAB — TROPONIN I

## 2017-12-21 LAB — POCT PREGNANCY, URINE: Preg Test, Ur: NEGATIVE

## 2017-12-21 NOTE — ED Triage Notes (Signed)
Patient c/o lower left abdominal pain and medial chest pain. Patient denies N/V, SOB, and other symptoms.

## 2017-12-22 ENCOUNTER — Emergency Department
Admission: EM | Admit: 2017-12-22 | Discharge: 2017-12-22 | Discharge status: Against Medical Advice | Payer: Medicaid Other | Attending: Emergency Medicine | Admitting: Emergency Medicine

## 2017-12-22 DIAGNOSIS — R1084 Generalized abdominal pain: Secondary | ICD-10-CM

## 2017-12-22 MED ORDER — IOPAMIDOL (ISOVUE-300) INJECTION 61%: 30.0000 mL | Freq: Once | INTRAVENOUS | Status: DC

## 2017-12-22 NOTE — ED Notes (Signed)
Pt states she does not want troponin checked, pt notified of what the troponin checks due to presenting complaint of chest pain, pt still declines blood collection for troponin. EDP notified.

## 2017-12-22 NOTE — ED Notes (Signed)
This RN went into room with IV supplies, pt sitting on side of bed and states "I want my discharge paperwork." This RN asked pt about CT scan and pt states "I don't want the scan, I want to leave." This RN notified pt of risks for leaving including loss of life or progressive decline in health without scan results and proper diagnosis and follow up and pt continues to state she wants to leave. EDP notified.

## 2017-12-22 NOTE — ED Notes (Signed)
Followed up with pt who is aware she will be getting CT scan, pt states she will get CT scan. Pt notified as well that IV would be needed for CT scan and pt states she will get an IV for CT scan.

## 2017-12-22 NOTE — ED Provider Notes (Signed)
[facility] Emergency Department Provider Note   ____________________________________________   First MD Initiated Contact with Patient 12/22/17 0221     (approximate)  I have reviewed the triage vital signs and the nursing notes.   HISTORY  Chief Complaint Abdominal Pain and Chest Pain    HPI Bonnie Duncan is a 22 y.o. female who comes into the hospital today with abdominal pain.  The patient states that she was seen for the same thing 3 weeks ago.  She received an ultrasound and states that she was told she had a stomach bug.  She reports that she did get better but the pain started back up again today.  She reports that she took a pain pill which she had been given before but it did not help today.  The patient denies any fevers, nausea, vomiting, constipation, diarrhea.  She states that her pain is a 8-1/2 out of 10 in intensity currently.  The patient states that she was feeling like she could not breathe because of the pain.  She decided to come back into the hospital for evaluation.   Past Medical History:  Diagnosis Date  . Diabetes mellitus without complication [facility](HCC)     Patient Active Problem List   Diagnosis Date Noted  . Obesity 02/04/2011  . Pre-diabetes 02/04/2011  . Essential hypertension, benign 02/04/2011  . Acquired acanthosis nigricans 02/04/2011    Past Surgical History:  Procedure Laterality Date  . TONSILLECTOMY      Prior to Admission medications   Medication Sig Start Date End Date Taking? Authorizing Provider  cephALEXin (KEFLEX) 500 MG capsule Take 1 capsule (500 mg total) by mouth 3 (three) times daily. 05/31/16   Minna AntisPaduchowski, Kevin, MD  dicyclomine (BENTYL) 20 MG tablet Take 1 tablet (20 mg total) by mouth 3 (three) times daily as needed (abdominal pain). 12/07/17   Phineas SemenGoodman, Graydon, MD  Doxylamine-Pyridoxine 10-10 MG TBEC Take 1 tablet by mouth 2 (two) times daily. 04/08/17   Willy Eddyobinson, Patrick, MD  ibuprofen  (ADVIL,MOTRIN) 800 MG tablet Take 1 tablet (800 mg total) by mouth every 8 (eight) hours as needed. 05/31/16   Minna AntisPaduchowski, Kevin, MD  ondansetron (ZOFRAN ODT) 4 MG disintegrating tablet Take 1 tablet (4 mg total) by mouth every 8 (eight) hours as needed for nausea or vomiting. Patient not taking: Reported on 05/31/2016 06/14/15   Darci CurrentBrown, [place] N, MD  oxyCODONE-acetaminophen (ROXICET) 5-325 MG per tablet Take 1 tablet by mouth every 4 (four) hours as needed for severe pain. Patient not taking: Reported on 05/31/2016 06/14/15   Darci CurrentBrown, [place] N, MD  traMADol (ULTRAM) 50 MG tablet Take 1 tablet (50 mg total) by mouth every 6 (six) hours as needed for moderate pain. 09/04/16   Joni ReiningSmith, Ronald K, PA-C    Allergies Acetaminophen  No family history on file.  Social History Social History   Tobacco Use  . Smoking status: Current Every Day Smoker    Packs/day: 0.50    Types: Cigarettes  . Smokeless tobacco: Never Used  Substance Use Topics  . Alcohol use: No  . Drug use: No    Review of Systems  Constitutional: No fever/chills Eyes: No visual changes. ENT: No sore throat. Cardiovascular: Denies chest pain. Respiratory: Denies shortness of breath. Gastrointestinal: abdominal pain.  No nausea, no vomiting.  No diarrhea.  No constipation. Genitourinary: Negative for dysuria. Musculoskeletal: Negative for back pain. Skin: Negative for rash. Neurological: Negative for headaches, focal weakness or numbness.   ____________________________________________  PHYSICAL EXAM:  VITAL SIGNS: ED Triage Vitals  Enc Vitals Group     BP 12/21/17 2222 (!) 141/81     Pulse Rate 12/21/17 2222 68     Resp 12/21/17 2222 18     Temp 12/21/17 2222 98 F (36.7 C)     Temp Source 12/21/17 2222 Oral     SpO2 12/21/17 2222 97 %     Weight 12/21/17 2222 (!) 340 lb (154.2 kg)     Height --      Head Circumference --      Peak Flow --      Pain Score 12/21/17 2223 10     Pain Loc --      Pain Edu?  --      Excl. in GC? --     Constitutional: Alert and oriented. Well appearing and in mild distress. Eyes: Conjunctivae are normal. PERRL. EOMI. Head: Atraumatic. Nose: No congestion/rhinnorhea. Mouth/Throat: Mucous membranes are moist.  Oropharynx non-erythematous. Cardiovascular: Normal rate, regular rhythm. Grossly normal heart sounds.  Good peripheral circulation. Respiratory: Normal respiratory effort.  No retractions. Lungs CTAB. Gastrointestinal: Soft with some diffuse abdominal tenderness to palpation worse in the left upper and left lower quadrant. No distention.  Positive bowel sounds Musculoskeletal: No lower extremity tenderness nor edema.  Neurologic:  Normal speech and language.  Skin:  Skin is warm, dry and intact.  Psychiatric: Mood and affect are normal.   ____________________________________________   LABS (all labs ordered are listed, but only abnormal results are displayed)  Labs Reviewed  COMPREHENSIVE METABOLIC PANEL - Abnormal; Notable for the following components:      Result Value   Calcium 8.2 (*)    All other components within normal limits  CBC - Abnormal; Notable for the following components:   Hemoglobin 11.7 (*)    MCHC 31.9 (*)    RDW 14.6 (*)    All other components within normal limits  URINALYSIS, COMPLETE (UACMP) WITH MICROSCOPIC - Abnormal; Notable for the following components:   Color, Urine STRAW (*)    APPearance CLEAR (*)    Squamous Epithelial / LPF 0-5 (*)    All other components within normal limits  LIPASE, BLOOD  TROPONIN I  TROPONIN I  POC URINE PREG, ED  POCT PREGNANCY, URINE   ____________________________________________  EKG  ED ECG REPORT I, Rebecka Apley, the attending physician, personally viewed and interpreted this ECG.   Date: 12/21/2017  EKG Time: 2228  Rate: 80  Rhythm: normal sinus rhythm  Axis: none  Intervals:none  ST&T Change:  none  ____________________________________________  RADIOLOGY  ED MD interpretation:  none  Official radiology report(s): No results found.  ____________________________________________   PROCEDURES  Procedure(s) performed: None  Procedures  Critical Care performed: No  ____________________________________________   INITIAL IMPRESSION / ASSESSMENT AND PLAN / ED COURSE  As part of my medical decision making, I reviewed the following data within the electronic MEDICAL RECORD NUMBER Notes from prior ED visits and [place] Controlled Substance Database   This is a 22 year old female who comes into the hospital today with abdominal pain.  My differential diagnosis includes gastritis, diverticulitis, colitis  We did check some blood work on the patient to include a CBC CMP and a urinalysis.  The patient also received a troponin which was negative.  Patient's remaining blood work was also negative.  I did order for the patient to receive a CT scan.  I explained to her that since she had an ultrasound  previously will perform a CT scan to see if there is a cause to her pain.  I also did order repeat troponin.  The nurse went in to draw a repeat troponin in place IV and the patient decided that she did not want any further evaluation.  She states that she is tired and just wants to go home.  We discussed the AMA guidelines with the patient and she states that she just was tired of being here and she wanted to go home.  The patient will be discharged home AGAINST MEDICAL ADVICE      ____________________________________________   FINAL CLINICAL IMPRESSION(S) / ED DIAGNOSES  Final diagnoses:  Generalized abdominal pain     ED Discharge Orders    None       Note:  This document was prepared using Dragon voice recognition software and may include unintentional dictation errors.    Rebecka Apley, MD 12/22/17 [phone]

## 2017-12-22 NOTE — ED Notes (Addendum)
Pt signed AMA on e-chart with this RN as witness, pt calm and appears in NAD at this time. Pt thanked this Charity fundraiserN and is being wheeled to lobby by her SO.

## 2018-01-07 ENCOUNTER — Encounter: Payer: Self-pay | Admitting: Certified Nurse Midwife

## 2018-02-22 ENCOUNTER — Ambulatory Visit
Admission: RE | Admit: 2018-02-22 | Discharge: 2018-02-22 | Disposition: A | Discharge status: Home / Self Care | Payer: Medicaid Other | Source: Ambulatory Visit | Attending: Obstetrics and Gynecology | Admitting: Obstetrics and Gynecology

## 2018-02-22 ENCOUNTER — Encounter: Payer: Self-pay | Admitting: *Deleted

## 2018-02-22 VITALS — BP 130/70 | HR 86 | Temp 97.4°F | Resp 18 | Ht 62.0 in | Wt 350.2 lb

## 2018-02-22 DIAGNOSIS — O99210 Obesity complicating pregnancy, unspecified trimester: Secondary | ICD-10-CM

## 2018-02-22 DIAGNOSIS — Z7189 Other specified counseling: Secondary | ICD-10-CM

## 2018-02-22 NOTE — Progress Notes (Addendum)
[facility]aternal-[facility]etal [facility] Consultation   Chief Complaint: History of IUFD at 33 weeks, review placental pathology and autopsy results.    HPI: [name] is a 22 y.o. G1P0 who recently delivered a stillborn female fetus after being diagnosed with an IUFD at [redacted]w[redacted]d.  Pregnancy was complicated by morbid obesity (BMI 60), HTN, diabetes (no meds required) and smoking as well as concern for fetal right sided aortic Duncan that was not able to be confirmed on fetal echocardiogram by Pediatric Cardiology. Just prior to delivery, Bonnie Duncan developed a fever with a Tmax of 39.3 postpartum; she was treated with Invanz.  Postpartum course was uncomplicated. Past Medical History: Patient  has a past medical history of Diabetes mellitus without complication (HCC). Morbid obesity.  HTN. Past Surgical History: She  has a past surgical history that includes Tonsillectomy and Hernia repair.  Obstetric History:  OB History    Gravida  1   Para      Term      Preterm      AB      Living  0     SAB      TAB      Ectopic      Multiple      Live Births            Delivered stillborn female infant on 10/20/2017 weighing 2470 grams  Gynecologic History:   Last pap smear was normal 04/2017  Medications: None Allergies: Patient is allergic to acetaminophen (causes itching)  Social History: Patient  reports that she has been smoking cigarettes.  She has been smoking about 5 cigarettes per day for 2 years. She has never used smokeless tobacco. She reports that she does not drink alcohol or use drugs. Lives with mother and boyfriend and feels safe at home.  Not currently working. Family History: Type II diabetes (father), heart failure (father), HTN (mother), stroke (father) Review of Systems A full 12 point review of systems was negative or as noted in the History of Present Illness.  No signs/symptoms of depression, SI or HI.    Physical Exam: LMP 02/04/2018   BP 130/70 Pulse 86 Temp (!) 97.4  F (36.3 C) Resp 18 Ht  (1.575 m) Wt (!) 350 lb 3.2 oz (158.8 kg) SpO2 97% BMI 64.05 kg/m    AUTOPSY REPORT: 34 week, 2 day gestation stillborn female fetus, appropriate size for gestational age at time of demise (33 weeks, 5 days). Body Weight: 2400 grams  Body Length (Crown-Heel): 47 cm. Crown-Rump Length: 33.7 cm.  Foot Length (Toe-Heel): 7.2 cm.  Severe maceration, skin sloughing, and autolysis, consistent with intrauterine fetal demise. Increased intra-alveolar fetal squamous cells suggestive of stress aspiration, lower lobes of bilateral lungs, mild. Right-sided aortic Duncan.  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  Retroesophageal aortic segment.   Ostium secundum atrial septal defect. Normal abdominal situs. All hollow viscera and orifices patent. No spinal defects or limb deformities. Developing brain and spinal cord without obvious abnormalities.   Cytogenetics Report: Chromosomal analysis (skin) [id number] Electronically signed by Rhae Lerner, MD on 11/17/2017  46,XY Female karyotype; no abnormality detected by chromosome analysis.    Surgical Pathology Report: [id number] Electronically signed by Nicolasa Ducking, MD on 10/23/2017  A. 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  We reviewed that the risk for congenital heart disease in subsequent pregnancies is increased compared to baseline but that these findings are unlikely to be responsible for the IUFD.  While the karyotype was normal, right sided aortic Duncan can be associated with 22.q11 deletion (DiGeorge syndrome) which will not be picked up on karyotype (requires microarray).  In a future pregnancy, recommend detailed ultrasound for anatomy and fetal echocardiogram with Pediatric Cardiology.  Could also consider genetic counseling.  Of note, maternal echocardiogram did not reveal any abnormalities other than LVH.  Maternal weight loss was strongly recommended as morbid obesity is an independent risk factor for stillbirth.  Weight loss may also decrease the risk for fetal anomalies, maternal HTN and improve diabetic control (note that her diabetes was not an issue during her pregnancy and she required no medication).  Lastly, I recommended quitting smoking as tobacco use may also be associated with stillbirth.  Any future pregnancy should be followed with serial [place] for growth, initiation of antenatal testing at around 29-30 weeks (about 1 month prior to her loss), use of baby ASA and maternal assessment of kick counts with delivery around 39 weeks in addition to the above recommendations for detailed anatomy US and fetal echocardiogram.  [name] currently is sexually active with her boyfriend and although she states she's not trying to conceive, they are not using birth control.  Birth control options were reviewed with her and she was encourage to schedule an appointment at the [facility] clinic should she decide to start; alternatively the couple may use condoms.  Addendum:  Prenatal labs were reviewed and were within normal limits; could  consider TSH as well as lupus anticoagulant and anticardiolipin antibody testing for completion.    Total time spent with the patient was 30 minutes with greater than 50% spent in counseling and coordination of care. We appreciate this interesting consult and will be happy to be involved in the ongoing care of [name] in anyway her obstetricians desire.  Kirby Funk, MD Maternal-Fetal Medicine [facility]

## 2018-02-22 NOTE — Addendum Note (Signed)
Encounter addended by: Kirby Funk, MD on: 02/22/2018 2:20 PM  Actions taken: Sign clinical note

## 2018-03-05 DIAGNOSIS — F17210 Nicotine dependence, cigarettes, uncomplicated: Secondary | ICD-10-CM | POA: Insufficient documentation

## 2018-03-05 DIAGNOSIS — I10 Essential (primary) hypertension: Secondary | ICD-10-CM | POA: Insufficient documentation

## 2018-03-05 DIAGNOSIS — R1030 Lower abdominal pain, unspecified: Secondary | ICD-10-CM | POA: Insufficient documentation

## 2018-03-05 DIAGNOSIS — E119 Type 2 diabetes mellitus without complications: Secondary | ICD-10-CM | POA: Insufficient documentation

## 2018-03-05 DIAGNOSIS — R1084 Generalized abdominal pain: Secondary | ICD-10-CM | POA: Insufficient documentation

## 2018-03-05 DIAGNOSIS — Z5320 Procedure and treatment not carried out because of patient's decision for unspecified reasons: Secondary | ICD-10-CM | POA: Insufficient documentation

## 2018-03-06 ENCOUNTER — Other Ambulatory Visit: Payer: Self-pay

## 2018-03-06 ENCOUNTER — Emergency Department
Admission: EM | Admit: 2018-03-06 | Discharge: 2018-03-06 | Discharge status: Against Medical Advice | Payer: Medicaid Other | Attending: Emergency Medicine | Admitting: Emergency Medicine

## 2018-03-06 DIAGNOSIS — R1084 Generalized abdominal pain: Secondary | ICD-10-CM

## 2018-03-06 LAB — COMPREHENSIVE METABOLIC PANEL
ALBUMIN: 4.4 g/dL (ref 3.5–5.0)
ALT: 17 U/L (ref 14–54)
AST: 17 U/L (ref 15–41)
Alkaline Phosphatase: 73 U/L (ref 38–126)
Anion gap: 7 (ref 5–15)
BUN: 11 mg/dL (ref 6–20)
CHLORIDE: 105 mmol/L (ref 101–111)
CO2: 25 mmol/L (ref 22–32)
CREATININE: 0.63 mg/dL (ref 0.44–1.00)
Calcium: 8.7 mg/dL — ABNORMAL LOW (ref 8.9–10.3)
GFR calc Af Amer: >60 mL/min (ref >60)
GLUCOSE: 96 mg/dL (ref 65–99)
POTASSIUM: 4 mmol/L (ref 3.5–5.1)
Sodium: 137 mmol/L (ref 135–145)
Total Bilirubin: 0.5 mg/dL (ref 0.3–1.2)
Total Protein: 8 g/dL (ref 6.5–8.1)

## 2018-03-06 LAB — CBC
HEMATOCRIT: 40.4 % (ref 35.0–47.0)
Hemoglobin: 13.2 g/dL (ref 12.0–16.0)
MCH: 27.2 pg (ref 26.0–34.0)
MCHC: 32.6 g/dL (ref 32.0–36.0)
MCV: 83.4 fL (ref 80.0–100.0)
PLATELETS: 292 10*3/uL (ref 150–440)
RBC: 4.85 MIL/uL (ref 3.80–5.20)
RDW: 16.2 % — AB (ref 11.5–14.5)
WBC: 12.2 10*3/uL — AB (ref 3.6–11.0)

## 2018-03-06 LAB — URINALYSIS, COMPLETE (UACMP) WITH MICROSCOPIC
BILIRUBIN URINE: NEGATIVE
Glucose, UA: NEGATIVE mg/dL
Hgb urine dipstick: NEGATIVE
Ketones, ur: NEGATIVE mg/dL
Nitrite: NEGATIVE
PH: 6 (ref 5.0–8.0)
Protein, ur: NEGATIVE mg/dL
SPECIFIC GRAVITY, URINE: 1.02 (ref 1.005–1.030)

## 2018-03-06 LAB — POCT PREGNANCY, URINE: PREG TEST UR: NEGATIVE

## 2018-03-06 LAB — LIPASE, BLOOD: LIPASE: 26 U/L (ref 11–51)

## 2018-03-06 MED ORDER — ONDANSETRON HCL 4 MG/2ML IJ SOLN
4.0000 mg | Freq: Once | INTRAMUSCULAR | Status: DC
  Filled 2018-03-06: qty 2

## 2018-03-06 MED ORDER — MORPHINE SULFATE (PF) 4 MG/ML IV SOLN
4.0000 mg | Freq: Once | INTRAVENOUS | Status: DC
  Filled 2018-03-06: qty 1

## 2018-03-06 MED ORDER — SODIUM CHLORIDE 0.9 % IV BOLUS: 1000.0000 mL | Freq: Once | INTRAVENOUS | Status: DC

## 2018-03-06 NOTE — ED Triage Notes (Signed)
Pt to the er for pain over the entire abdomen from the upper quads to the lower quads. Denies dysuria, abnormal bowel movements, vomiting. Sore when she moves, pt unable to ambulate. No distress noted.

## 2018-03-06 NOTE — ED Provider Notes (Signed)
[facility] Emergency Department Provider Note   ____________________________________________   First MD Initiated Contact with Patient 03/06/18 [phone]     (approximate)  I have reviewed the triage vital signs and the nursing notes.   HISTORY  Chief Complaint Abdominal Pain    HPI Bonnie Duncan is a 22 y.o. female who comes into the hospital today with some abdominal pain.  The patient states that it started tonight.  She reports that it came out of nowhere.  She did not take anything for pain at home.  His pain in her upper and lower abdomen.  The patient denies any nausea vomiting or diarrhea.  She also denies any constipation.  She states that sharp pain that she rates an 8 out of 10 in intensity.  The patient has had this pain before and reports that it comes monthly.  She is comes here when she cannot manage the pain.  The patient states that she is been evaluated for it before but is unsure exactly what is causing this pain.   Past Medical History:  Diagnosis Date  . Diabetes mellitus without complication [facility])     Patient Active Problem List   Diagnosis Date Noted  . Obesity 02/04/2011  . Pre-diabetes 02/04/2011  . Essential hypertension, benign 02/04/2011  . Acquired acanthosis nigricans 02/04/2011    Past Surgical History:  Procedure Laterality Date  . HERNIA REPAIR    . TONSILLECTOMY      Prior to Admission medications   Medication Sig Start Date End Date Taking? Authorizing Provider  cephALEXin (KEFLEX) 500 MG capsule Take 1 capsule (500 mg total) by mouth 3 (three) times daily. Patient not taking: Reported on 02/22/2018 05/31/16   Minna Antis, MD  dicyclomine (BENTYL) 20 MG tablet Take 1 tablet (20 mg total) by mouth 3 (three) times daily as needed (abdominal pain). Patient not taking: Reported on 02/22/2018 12/07/17   Phineas Semen, MD  Doxylamine-Pyridoxine 10-10 MG TBEC Take 1 tablet by mouth 2 (two) times daily. Patient not  taking: Reported on 02/22/2018 04/08/17   Willy Eddy, MD  ibuprofen (ADVIL,MOTRIN) 800 MG tablet Take 1 tablet (800 mg total) by mouth every 8 (eight) hours as needed. Patient not taking: Reported on 02/22/2018 05/31/16   Minna Antis, MD  ondansetron (ZOFRAN ODT) 4 MG disintegrating tablet Take 1 tablet (4 mg total) by mouth every 8 (eight) hours as needed for nausea or vomiting. Patient not taking: Reported on 05/31/2016 06/14/15   Darci Current, MD  oxyCODONE-acetaminophen (ROXICET) 5-325 MG per tablet Take 1 tablet by mouth every 4 (four) hours as needed for severe pain. Patient not taking: Reported on 05/31/2016 06/14/15   Darci Current, MD  traMADol (ULTRAM) 50 MG tablet Take 1 tablet (50 mg total) by mouth every 6 (six) hours as needed for moderate pain. Patient not taking: Reported on 02/22/2018 09/04/16   Joni Reining, PA-C    Allergies Acetaminophen  No family history on file.  Social History Social History   Tobacco Use  . Smoking status: Current Every Day Smoker    Packs/day: 0.50    Types: Cigarettes  . Smokeless tobacco: Never Used  Substance Use Topics  . Alcohol use: No  . Drug use: No    Review of Systems  Constitutional: No fever/chills Eyes: No visual changes. ENT: No sore throat. Cardiovascular: Denies chest pain. Respiratory: Denies shortness of breath. Gastrointestinal:  abdominal pain.  No nausea, no vomiting.  No diarrhea.  No  constipation. Genitourinary: Negative for dysuria. Musculoskeletal: Negative for back pain. Skin: Negative for rash. Neurological: Negative for headaches, focal weakness or numbness.   ____________________________________________   PHYSICAL EXAM:  VITAL SIGNS: ED Triage Vitals  Enc Vitals Group     BP 03/06/18 0009 117/83     Pulse Rate 03/06/18 0009 94     Resp 03/06/18 0009 18     Temp 03/06/18 0009 98.1 F (36.7 C)     Temp Source 03/06/18 0009 Oral     SpO2 03/06/18 0009 98 %     Weight 03/06/18  0011 (!) 325 lb (147.4 kg)     Height 03/06/18 0011  (1.575 m)     Head Circumference --      Peak Flow --      Pain Score 03/06/18 0010 8     Pain Loc --      Pain Edu? --      Excl. in GC? --     Constitutional: Alert and oriented. Well appearing and in moderate distress. Eyes: Conjunctivae are normal. PERRL. EOMI. Head: Atraumatic. Nose: No congestion/rhinnorhea. Mouth/Throat: Mucous membranes are moist.  Oropharynx non-erythematous. Cardiovascular: Normal rate, regular rhythm. Grossly normal heart sounds.  Good peripheral circulation. Respiratory: Normal respiratory effort.  No retractions. Lungs CTAB. Gastrointestinal: Soft with some diffuse abdominal tenderness to palpation. No distention.  Positive bowel sounds Musculoskeletal: No lower extremity tenderness nor edema.   Neurologic:  Normal speech and language.  Skin:  Skin is warm, dry and intact.  Psychiatric: Mood and affect are normal.   ____________________________________________   LABS (all labs ordered are listed, but only abnormal results are displayed)  Labs Reviewed  COMPREHENSIVE METABOLIC PANEL - Abnormal; Notable for the following components:      Result Value   Calcium 8.7 (*)    All other components within normal limits  CBC - Abnormal; Notable for the following components:   WBC 12.2 (*)    RDW 16.2 (*)    All other components within normal limits  URINALYSIS, COMPLETE (UACMP) WITH MICROSCOPIC - Abnormal; Notable for the following components:   Color, Urine YELLOW (*)    APPearance HAZY (*)    Leukocytes, UA TRACE (*)    Bacteria, UA RARE (*)    All other components within normal limits  LIPASE, BLOOD  POC URINE PREG, ED  POCT PREGNANCY, URINE   ____________________________________________  EKG  None ____________________________________________  RADIOLOGY  ED MD interpretation: None  Official radiology report(s): No results  found.  ____________________________________________   PROCEDURES  Procedure(s) performed: None  Procedures  Critical Care performed: No  ____________________________________________   INITIAL IMPRESSION / ASSESSMENT AND PLAN / ED COURSE  As part of my medical decision making, I reviewed the following data within the electronic MEDICAL RECORD NUMBER Notes from prior ED visits and [place] Controlled Substance Database   This is a 22 year old female who comes into the hospital today with abdominal pain  My differential diagnosis includes colitis, pancreatitis, cholecystitis  The patient had some blood work drawn and it was unremarkable.  She has a white blood cell count of 12.2 with the remaining blood work is negative.  I did speak to the patient about performing a CT scan since she has been here multiple times in the past for abdominal pain and has not received a CT scan.  The patient was in agreement initially.  A short time later the nurse came in and stated that the patient was given sign out AGAINST  MEDICAL ADVICE and was refusing the CT scan.  I was in with another patient so I was unable to go to the patient and she left AGAINST MEDICAL ADVICE prior to my being able to speak to her.      ____________________________________________   FINAL CLINICAL IMPRESSION(S) / ED DIAGNOSES  Final diagnoses:  Generalized abdominal pain     ED Discharge Orders    None       Note:  This document was prepared using Dragon voice recognition software and may include unintentional dictation errors.    Rebecka Apley, MD 03/06/18 [phone]

## 2018-03-06 NOTE — ED Notes (Signed)
Patient continues to request to leave AMA. Vital signs taken. Patient again cautioned against dangers of leaving AMA. Patient verbalized understanding of all information discussed. Patient left.

## 2018-03-06 NOTE — ED Notes (Signed)
Patient refusing IV insertion and medication administration. Patient asking to be discharged AMA. Patient counseled against dangers of leaving without further evaluation and treatment. Patient verbalized understanding of information discussed. Patient continued to request to be discharged AMA. MD informed.

## 2018-04-06 ENCOUNTER — Encounter: Payer: Self-pay | Admitting: Obstetrics and Gynecology

## 2018-05-12 ENCOUNTER — Emergency Department
Admission: EM | Admit: 2018-05-12 | Discharge: 2018-05-12 | Disposition: A | Discharge status: Home / Self Care | Payer: Medicaid Other | Attending: Emergency Medicine | Admitting: Emergency Medicine

## 2018-05-12 ENCOUNTER — Encounter: Payer: Self-pay | Admitting: Emergency Medicine

## 2018-05-12 DIAGNOSIS — F17210 Nicotine dependence, cigarettes, uncomplicated: Secondary | ICD-10-CM | POA: Insufficient documentation

## 2018-05-12 DIAGNOSIS — I10 Essential (primary) hypertension: Secondary | ICD-10-CM | POA: Insufficient documentation

## 2018-05-12 DIAGNOSIS — E119 Type 2 diabetes mellitus without complications: Secondary | ICD-10-CM | POA: Insufficient documentation

## 2018-05-12 DIAGNOSIS — J029 Acute pharyngitis, unspecified: Secondary | ICD-10-CM | POA: Insufficient documentation

## 2018-05-12 LAB — GROUP A STREP BY PCR: Group A Strep by PCR: NOT DETECTED

## 2018-05-12 MED ORDER — MAGIC MOUTHWASH W/LIDOCAINE: 5.0000 mL | Freq: Four times a day (QID) | ORAL | 0 refills | Status: DC

## 2018-05-12 MED ORDER — PROMETHAZINE-DM 6.25-15 MG/5ML PO SYRP: 5.0000 mL | ORAL_SOLUTION | Freq: Four times a day (QID) | ORAL | 0 refills | Status: DC | PRN

## 2018-05-12 MED ORDER — ONDANSETRON 8 MG PO TBDP
8.0000 mg | ORAL_TABLET | Freq: Once | ORAL | Status: AC
  Administered 2018-05-12: 8 mg via ORAL
  Filled 2018-05-12: qty 1

## 2018-05-12 MED ORDER — METHYLPREDNISOLONE 4 MG PO TBPK: ORAL_TABLET | ORAL | 0 refills | Status: DC

## 2018-05-12 NOTE — ED Notes (Signed)
Pt c/o sore throat for the past 2 days, also c/o epigastric pain with nausea for the past week with 1 bloody stool 2 days ago, normal stool today. Pt c/o BL feet swelling with intermittent numbness. Pt is a/ox4, ambulatory to exam from without difficulty.

## 2018-05-12 NOTE — Discharge Instructions (Addendum)
Follow discharge care instruction take medication as directed. °

## 2018-05-12 NOTE — ED Triage Notes (Signed)
Patient presents to ED via POV from home with multiple complaints. Patient reports sore throat, nausea and bilateral feet swelling. Patient reports being on her feet a lot lately and that the swelling in her feet goes away when she elevates her feet. Even and non labored respirations noted.

## 2018-05-12 NOTE — ED Provider Notes (Signed)
[facility] Emergency Department Provider Note   ____________________________________________   First MD Initiated Contact with Patient 05/12/18 0830     (approximate)  I have reviewed the triage vital signs and the nursing notes.   HISTORY  Chief Complaint Sore Throat    HPI Bonnie Duncan is a 22 y.o. female patient complained of sore throat and nausea.  Patient denies vomiting or diarrhea.  Patient also complain of bilateral feet swelling with prolonged standing.  This is a chronic complaint concerning the feet.  Patient denies fever/chills.  Patient state can tolerate fluid and food with mild discomfort.  Patient rates her sore throat as 8/10.  Patient described the pain is "sore".  No palliative measures for either complaints.  Past Medical History:  Diagnosis Date  . Diabetes mellitus without complication [facility])     Patient Active Problem List   Diagnosis Date Noted  . Obesity 02/04/2011  . Pre-diabetes 02/04/2011  . Essential hypertension, benign 02/04/2011  . Acquired acanthosis nigricans 02/04/2011    Past Surgical History:  Procedure Laterality Date  . HERNIA REPAIR    . TONSILLECTOMY      Prior to Admission medications   Medication Sig Start Date End Date Taking? Authorizing Provider  cephALEXin (KEFLEX) 500 MG capsule Take 1 capsule (500 mg total) by mouth 3 (three) times daily. Patient not taking: Reported on 02/22/2018 05/31/16   Minna Antis, MD  dicyclomine (BENTYL) 20 MG tablet Take 1 tablet (20 mg total) by mouth 3 (three) times daily as needed (abdominal pain). Patient not taking: Reported on 02/22/2018 12/07/17   Phineas Semen, MD  Doxylamine-Pyridoxine 10-10 MG TBEC Take 1 tablet by mouth 2 (two) times daily. Patient not taking: Reported on 02/22/2018 04/08/17   Willy Eddy, MD  ibuprofen (ADVIL,MOTRIN) 800 MG tablet Take 1 tablet (800 mg total) by mouth every 8 (eight) hours as needed. Patient not taking: Reported  on 02/22/2018 05/31/16   Minna Antis, MD  magic mouthwash w/lidocaine SOLN Take 5 mLs by mouth 4 (four) times daily. 05/12/18   Joni Reining, PA-C  methylPREDNISolone (MEDROL DOSEPAK) 4 MG TBPK tablet Take Tapered dose as directed 05/12/18   Joni Reining, PA-C  ondansetron (ZOFRAN ODT) 4 MG disintegrating tablet Take 1 tablet (4 mg total) by mouth every 8 (eight) hours as needed for nausea or vomiting. Patient not taking: Reported on 05/31/2016 06/14/15   Darci Current, MD  oxyCODONE-acetaminophen (ROXICET) 5-325 MG per tablet Take 1 tablet by mouth every 4 (four) hours as needed for severe pain. Patient not taking: Reported on 05/31/2016 06/14/15   Darci Current, MD  promethazine-dextromethorphan (PROMETHAZINE-DM) 6.25-15 MG/5ML syrup Take 5 mLs by mouth 4 (four) times daily as needed for cough. 05/12/18   Joni Reining, PA-C  traMADol (ULTRAM) 50 MG tablet Take 1 tablet (50 mg total) by mouth every 6 (six) hours as needed for moderate pain. Patient not taking: Reported on 02/22/2018 09/04/16   Joni Reining, PA-C    Allergies Acetaminophen  No family history on file.  Social History Social History   Tobacco Use  . Smoking status: Current Every Day Smoker    Packs/day: 0.50    Types: Cigarettes  . Smokeless tobacco: Never Used  Substance Use Topics  . Alcohol use: No  . Drug use: No    Review of Systems Constitutional: No fever/chills Eyes: No visual changes. ENT: No sore throat. Cardiovascular: Denies chest pain. Respiratory: Denies shortness of breath. Gastrointestinal: No  abdominal pain.  No nausea, no vomiting.  No diarrhea.  No constipation. Genitourinary: Negative for dysuria. Musculoskeletal: Negative for back pain. Skin: Negative for rash. Neurological: Negative for headaches, focal weakness or numbness. Endocrine:Obesity, prediabetic, and hypertension. Allergic/Immunilogical: Tylenol  ____________________________________________   PHYSICAL  EXAM:  VITAL SIGNS: ED Triage Vitals [05/12/18 0803]  Enc Vitals Group     BP (!) 144/93     Pulse Rate 87     Resp 16     Temp 98.1 F (36.7 C)     Temp Source Oral     SpO2 99 %     Weight (!) 347 lb (157.4 kg)     Height 5\' 1"  (1.549 m)     Head Circumference      Peak Flow      Pain Score 8     Pain Loc      Pain Edu?      Excl. in GC?     Constitutional: Alert and oriented. Well appearing and in no acute distress.  Morbid obesity. Mouth/Throat: Mucous membranes are moist.  Oropharynx erythematous. Neck: No stridor.  No cervical spine tenderness to palpation. Hematological/Lymphatic/Immunilogical: No cervical lymphadenopathy. Cardiovascular: Normal rate, regular rhythm. Grossly normal heart sounds.  Good peripheral circulation. Respiratory: Normal respiratory effort.  No retractions. Lungs CTAB. Gastrointestinal: Soft and nontender. No distention. No abdominal bruits. No CVA tenderness. Genitourinary: Deferred Musculoskeletal: No lower extremity tenderness nor edema.  No joint effusions. Neurologic:  Normal speech and language. No gross focal neurologic deficits are appreciated. No gait instability. Skin:  Skin is warm, dry and intact. No rash noted. Psychiatric: Mood and affect are normal. Speech and behavior are normal.  ____________________________________________   LABS (all labs ordered are listed, but only abnormal results are displayed)  Labs Reviewed  GROUP A STREP BY PCR   ____________________________________________  EKG   ____________________________________________  RADIOLOGY  ED MD interpretation:    Official radiology report(s): No results found.  ____________________________________________   PROCEDURES  Procedure(s) performed: None  Procedures  Critical Care performed: No  ____________________________________________   INITIAL IMPRESSION / ASSESSMENT AND PLAN / ED COURSE  As part of my medical decision making, I reviewed the  following data within the electronic MEDICAL RECORD NUMBER    Sore throat secondary to viral pharyngitis.  Discussed negative strep results with patient.  Patient given discharge care instructions, work note, and advised to take medication as directed.  [facility]      ____________________________________________   FINAL CLINICAL IMPRESSION(S) / ED DIAGNOSES  Final diagnoses:  Viral pharyngitis     ED Discharge Orders        Ordered    magic mouthwash w/lidocaine SOLN  4 times daily     05/12/18 0951    promethazine-dextromethorphan (PROMETHAZINE-DM) 6.25-15 MG/5ML syrup  4 times daily PRN     05/12/18 0951    methylPREDNISolone (MEDROL DOSEPAK) 4 MG TBPK tablet     05/12/18 [id number]       Note:  This document was prepared using Dragon voice recognition software and may include unintentional dictation errors.    Joni ReiningSmith, Ronald K, PA-C 05/12/18 [id number]    Minna AntisPaduchowski, Kevin, MD 05/12/18 [id number]

## 2018-07-02 ENCOUNTER — Other Ambulatory Visit: Payer: Self-pay

## 2018-07-02 ENCOUNTER — Emergency Department
Admission: EM | Admit: 2018-07-02 | Discharge: 2018-07-02 | Disposition: A | Discharge status: Home / Self Care | Payer: Medicaid Other | Attending: Emergency Medicine | Admitting: Emergency Medicine

## 2018-07-02 ENCOUNTER — Encounter: Payer: Self-pay | Admitting: Emergency Medicine

## 2018-07-02 DIAGNOSIS — F17210 Nicotine dependence, cigarettes, uncomplicated: Secondary | ICD-10-CM | POA: Insufficient documentation

## 2018-07-02 DIAGNOSIS — J069 Acute upper respiratory infection, unspecified: Secondary | ICD-10-CM | POA: Insufficient documentation

## 2018-07-02 DIAGNOSIS — E119 Type 2 diabetes mellitus without complications: Secondary | ICD-10-CM | POA: Insufficient documentation

## 2018-07-02 DIAGNOSIS — Z79899 Other long term (current) drug therapy: Secondary | ICD-10-CM | POA: Insufficient documentation

## 2018-07-02 MED ORDER — PSEUDOEPH-BROMPHEN-DM 30-2-10 MG/5ML PO SYRP: 5.0000 mL | ORAL_SOLUTION | Freq: Four times a day (QID) | ORAL | 0 refills | Status: DC | PRN

## 2018-07-02 NOTE — ED Triage Notes (Signed)
Cough, sore throat, headache and sneezing since yesterday.

## 2018-07-02 NOTE — ED Notes (Signed)
See triage note  States she developed fever last pm ,cough sore throat and runny nose

## 2018-07-02 NOTE — ED Provider Notes (Signed)
[facility] Emergency Department Provider Note  ____________________________________________  Time seen: Approximately 7:53 AM  I have reviewed the triage vital signs and the nursing notes.   HISTORY  Chief Complaint Cough and Sore Throat   HPI Bonnie Duncan is a 22 y.o. female who presents to the ER for treatment and evaluation of sore throat, cough, sneezing, and headache since yesterday. Fever yesterday up to 100.2 that was relieved with ibuprofen.    Past Medical History:  Diagnosis Date  . Diabetes mellitus without complication [facility])     Patient Active Problem List   Diagnosis Date Noted  . Obesity 02/04/2011  . Pre-diabetes 02/04/2011  . Essential hypertension, benign 02/04/2011  . Acquired acanthosis nigricans 02/04/2011    Past Surgical History:  Procedure Laterality Date  . HERNIA REPAIR    . TONSILLECTOMY      Prior to Admission medications   Medication Sig Start Date End Date Taking? Authorizing Provider  brompheniramine-pseudoephedrine-DM 30-2-10 MG/5ML syrup Take 5 mLs by mouth 4 (four) times daily as needed. 07/02/18   Quavion Boule B, FNP  cephALEXin (KEFLEX) 500 MG capsule Take 1 capsule (500 mg total) by mouth 3 (three) times daily. Patient not taking: Reported on 02/22/2018 05/31/16   Minna Antis, MD  dicyclomine (BENTYL) 20 MG tablet Take 1 tablet (20 mg total) by mouth 3 (three) times daily as needed (abdominal pain). Patient not taking: Reported on 02/22/2018 12/07/17   Phineas Semen, MD  Doxylamine-Pyridoxine 10-10 MG TBEC Take 1 tablet by mouth 2 (two) times daily. Patient not taking: Reported on 02/22/2018 04/08/17   Willy Eddy, MD  ibuprofen (ADVIL,MOTRIN) 800 MG tablet Take 1 tablet (800 mg total) by mouth every 8 (eight) hours as needed. Patient not taking: Reported on 02/22/2018 05/31/16   Minna Antis, MD  magic mouthwash w/lidocaine SOLN Take 5 mLs by mouth 4 (four) times daily. 05/12/18   Joni Reining,  PA-C  methylPREDNISolone (MEDROL DOSEPAK) 4 MG TBPK tablet Take Tapered dose as directed 05/12/18   Joni Reining, PA-C  ondansetron (ZOFRAN ODT) 4 MG disintegrating tablet Take 1 tablet (4 mg total) by mouth every 8 (eight) hours as needed for nausea or vomiting. Patient not taking: Reported on 05/31/2016 06/14/15   Darci Current, MD  oxyCODONE-acetaminophen (ROXICET) 5-325 MG per tablet Take 1 tablet by mouth every 4 (four) hours as needed for severe pain. Patient not taking: Reported on 05/31/2016 06/14/15   Darci Current, MD  promethazine-dextromethorphan (PROMETHAZINE-DM) 6.25-15 MG/5ML syrup Take 5 mLs by mouth 4 (four) times daily as needed for cough. 05/12/18   Joni Reining, PA-C  traMADol (ULTRAM) 50 MG tablet Take 1 tablet (50 mg total) by mouth every 6 (six) hours as needed for moderate pain. Patient not taking: Reported on 02/22/2018 09/04/16   Joni Reining, PA-C    Allergies Acetaminophen  No family history on file.  Social History Social History   Tobacco Use  . Smoking status: Current Every Day Smoker    Packs/day: 0.50    Types: Cigarettes  . Smokeless tobacco: Never Used  Substance Use Topics  . Alcohol use: No  . Drug use: No    Review of Systems Constitutional: Positive fever/chills ENT: Positive sore throat. Cardiovascular: Denies chest pain. Respiratory: Negative for shortness of breath. Positive for cough. Gastrointestinal: Negative for nausea,  no vomiting.  Negative for diarrhea.  Musculoskeletal: Negative for body aches Skin: Negative for rash. Neurological: Negative for headaches ____________________________________________   PHYSICAL  EXAM:  VITAL SIGNS: ED Triage Vitals [07/02/18 0727]  Enc Vitals Group     BP 121/75     Pulse Rate 89     Resp 18     Temp 97.7 F (36.5 C)     Temp Source Oral     SpO2 98 %     Weight (!) 323 lb (146.5 kg)     Height 5\' 1"  (1.549 m)     Head Circumference      Peak Flow      Pain Score 8      Pain Loc      Pain Edu?      Excl. in GC?     Constitutional: Alert and oriented. Well appearing and in no acute distress. Eyes: Conjunctivae are normal. EOMI. Ears: Bilateral TM normal Nose: Maxillary sinus congestion noted; no rhinnorhea. Mouth/Throat: Mucous membranes are moist.  Oropharynx with cobblestone exudate. Tonsils not visualized. Neck: No stridor.  Lymphatic: No cervical lymphadenopathy. Cardiovascular: Normal rate, regular rhythm. Good peripheral circulation. Respiratory: Normal respiratory effort.  No retractions. Breath sounds clear. Gastrointestinal: Soft and nontender.  Musculoskeletal: FROM x 4 extremities.  Neurologic:  Normal speech and language.  Skin:  Skin is warm, dry and intact. No rash noted. Psychiatric: Mood and affect are normal. Speech and behavior are normal.  ____________________________________________   LABS (all labs ordered are listed, but only abnormal results are displayed)  Labs Reviewed - No data to display ____________________________________________  EKG  Not indicated. ____________________________________________  RADIOLOGY  Not indicated. ____________________________________________   PROCEDURES  Procedure(s) performed: None  Critical Care performed: No ____________________________________________   INITIAL IMPRESSION / ASSESSMENT AND PLAN / ED COURSE  22 y.o. female who presents to the emergency department for treatment and evaluation of symptoms and exam most consistent with URI. She will be treated with Bromfed and advised to follow up with the PCP of her choice for symptoms that are not improving over the week. She is to return to the ER for symptoms that change or worsen if unable to schedule an appointment.    Medications - No data to display  ED Discharge Orders         Ordered    brompheniramine-pseudoephedrine-DM 30-2-10 MG/5ML syrup  4 times daily PRN     07/02/18 0803           Pertinent labs &  imaging results that were available during my care of the patient were reviewed by me and considered in my medical decision making (see chart for details).    If controlled substance prescribed during this visit, 12 month history viewed on the [facility] prior to issuing an initial prescription for Schedule II or III opiod. ____________________________________________   FINAL CLINICAL IMPRESSION(S) / ED DIAGNOSES  Final diagnoses:  Viral upper respiratory tract infection    Note:  This document was prepared using Dragon voice recognition software and may include unintentional dictation errors.     Chinita Pesterriplett, Karista Aispuro B, FNP 07/02/18 [id number]    Minna AntisPaduchowski, Kevin, MD 07/02/18 1452

## 2018-09-01 ENCOUNTER — Emergency Department
Admission: EM | Admit: 2018-09-01 | Discharge: 2018-09-01 | Disposition: A | Discharge status: Home / Self Care | Payer: Medicaid Other | Attending: Emergency Medicine | Admitting: Emergency Medicine

## 2018-09-01 ENCOUNTER — Other Ambulatory Visit: Payer: Self-pay

## 2018-09-01 DIAGNOSIS — I10 Essential (primary) hypertension: Secondary | ICD-10-CM | POA: Insufficient documentation

## 2018-09-01 DIAGNOSIS — E119 Type 2 diabetes mellitus without complications: Secondary | ICD-10-CM | POA: Insufficient documentation

## 2018-09-01 DIAGNOSIS — F17210 Nicotine dependence, cigarettes, uncomplicated: Secondary | ICD-10-CM | POA: Insufficient documentation

## 2018-09-01 DIAGNOSIS — N390 Urinary tract infection, site not specified: Secondary | ICD-10-CM

## 2018-09-01 LAB — URINALYSIS, COMPLETE (UACMP) WITH MICROSCOPIC
BACTERIA UA: NONE SEEN
BILIRUBIN URINE: NEGATIVE
Glucose, UA: NEGATIVE mg/dL
HGB URINE DIPSTICK: NEGATIVE
KETONES UR: NEGATIVE mg/dL
LEUKOCYTES UA: NEGATIVE
NITRITE: NEGATIVE
PH: 7 (ref 5.0–8.0)
Protein, ur: NEGATIVE mg/dL
Specific Gravity, Urine: 1.018 (ref 1.005–1.030)

## 2018-09-01 LAB — POCT PREGNANCY, URINE: Preg Test, Ur: NEGATIVE

## 2018-09-01 MED ORDER — CEPHALEXIN 500 MG PO CAPS: 500.0000 mg | ORAL_CAPSULE | Freq: Three times a day (TID) | ORAL | 0 refills | Status: DC

## 2018-09-01 NOTE — ED Triage Notes (Signed)
Pt c/o pressure pain with urination for the past week states she has been taking OTC meds with no relief.

## 2018-09-01 NOTE — ED Provider Notes (Signed)
[facility] Emergency Department Provider Note   ____________________________________________   First MD Initiated Contact with Patient 09/01/18 1042     (approximate)  I have reviewed the triage vital signs and the nursing notes.   HISTORY  Chief Complaint Urinary Frequency   HPI Bonnie Duncan is a 22 y.o. female resents to the ED with complaint of urinary symptoms off and on for 1 week but worse today.  Patient states that her last UTI was 1 year ago.  She denies any fever, chills, nausea or vomiting.  She denies any vaginal discharge.   Past Medical History:  Diagnosis Date  . Diabetes mellitus without complication [facility])     Patient Active Problem List   Diagnosis Date Noted  . Obesity 02/04/2011  . Pre-diabetes 02/04/2011  . Essential hypertension, benign 02/04/2011  . Acquired acanthosis nigricans 02/04/2011    Past Surgical History:  Procedure Laterality Date  . HERNIA REPAIR    . TONSILLECTOMY      Prior to Admission medications   Medication Sig Start Date End Date Taking? Authorizing Provider  cephALEXin (KEFLEX) 500 MG capsule Take 1 capsule (500 mg total) by mouth 3 (three) times daily. 09/01/18   Tommi Rumps, PA-C    Allergies Acetaminophen  No family history on file.  Social History Social History   Tobacco Use  . Smoking status: Current Every Day Smoker    Packs/day: 0.50    Types: Cigarettes  . Smokeless tobacco: Never Used  Substance Use Topics  . Alcohol use: No  . Drug use: No    Review of Systems Constitutional: No fever/chills Eyes: No visual changes. ENT: No sore throat. Cardiovascular: Denies chest pain. Respiratory: Denies shortness of breath. Gastrointestinal: No abdominal pain.  No nausea, no vomiting.  No diarrhea.  No constipation. Genitourinary: Positive for urinary frequency.  Positive for urgency. Musculoskeletal: Negative for back pain. Skin: Negative for rash. Neurological:  Negative for headaches, focal weakness or numbness. ____________________________________________   PHYSICAL EXAM:  VITAL SIGNS: ED Triage Vitals  Enc Vitals Group     BP 09/01/18 1012 128/67     Pulse Rate 09/01/18 1011 77     Resp 09/01/18 1011 18     Temp 09/01/18 1011 98.2 F (36.8 C)     Temp Source 09/01/18 1011 Oral     SpO2 09/01/18 1011 99 %     Weight 09/01/18 1012 (!) 347 lb (157.4 kg)     Height 09/01/18 1012 5\' 1"  (1.549 m)     Head Circumference --      Peak Flow --      Pain Score 09/01/18 1012 8     Pain Loc --      Pain Edu? --      Excl. in GC? --    Constitutional: Alert and oriented. Well appearing and in no acute distress.  Moderately obese. Eyes: Conjunctivae are normal. PERRL. EOMI. Head: Atraumatic. Neck: No stridor.   Cardiovascular: Normal rate, regular rhythm. Grossly normal heart sounds.  Good peripheral circulation. Respiratory: Normal respiratory effort.  No retractions. Lungs CTAB. Gastrointestinal: Soft and nontender. No distention.  No CVA tenderness. Musculoskeletal: Moves upper and lower extremities that any difficulty.  Normal gait was noted. Neurologic:  Normal speech and language. No gross focal neurologic deficits are appreciated.  Skin:  Skin is warm, dry and intact.  Psychiatric: Mood and affect are normal. Speech and behavior are normal.  ____________________________________________   LABS (all labs ordered are  listed, but only abnormal results are displayed)  Labs Reviewed  URINALYSIS, COMPLETE (UACMP) WITH MICROSCOPIC - Abnormal; Notable for the following components:      Result Value   Color, Urine YELLOW (*)    APPearance CLOUDY (*)    All other components within normal limits  URINE CULTURE  POCT PREGNANCY, URINE  POC URINE PREG, ED     PROCEDURES  Procedure(s) performed: None  Procedures  Critical Care performed: No  ____________________________________________   INITIAL IMPRESSION / ASSESSMENT AND PLAN /  ED COURSE  As part of my medical decision making, I reviewed the following data within the electronic MEDICAL RECORD NUMBER Notes from prior ED visits and [place] Controlled Substance Database  Patient presents to the ED with complaint of urinary symptoms along with urgency.  Patient has a history of urinary tract infections.  She denies any fever, chills, nausea or vomiting.  She denies any CVA tenderness.  Urinalysis was noted.  Culture was obtained.  Patient was started on Keflex 500 mg 3 times daily for 10 days.  She is to follow-up with her PCP if any continued problems and encouraged to increase fluids.  She is to return to the emergency department if any onset of fever, chills, nausea or vomiting or inability to take the antibiotic.  ____________________________________________   FINAL CLINICAL IMPRESSION(S) / ED DIAGNOSES  Final diagnoses:  Urinary tract infection without hematuria, site unspecified     ED Discharge Orders         Ordered    cephALEXin (KEFLEX) 500 MG capsule  3 times daily     09/01/18 1124           Note:  This document was prepared using Dragon voice recognition software and may include unintentional dictation errors.    Tommi RumpsSummers, Denisia Harpole L, PA-C 09/01/18 1318    Governor RooksLord, Rebecca, MD 09/01/18 [phone]

## 2018-09-01 NOTE — Discharge Instructions (Signed)
Follow-up with your primary care provider or [facility] acute care if any continued problems.  Increase fluids.  Begin taking antibiotics 3 times a day for the next 10 days.  Do not stop taking this medication until you have completely finished.

## 2018-09-01 NOTE — ED Notes (Signed)
FIRST NURSE NOTE: Abdominal pain x 2 weeks, radiating to the back worse this morning.

## 2018-09-01 NOTE — ED Notes (Addendum)
Pt complaining of urinary frequency and a pressure sensation when urinating. States she has been having lower abdominal pain and pressure over the last few days

## 2018-09-02 LAB — URINE CULTURE: SPECIAL REQUESTS: NORMAL

## 2018-09-29 ENCOUNTER — Emergency Department
Admission: EM | Admit: 2018-09-29 | Discharge: 2018-09-29 | Disposition: A | Discharge status: Home / Self Care | Payer: Self-pay | Attending: Student in an Organized Health Care Education/Training Program | Admitting: Student in an Organized Health Care Education/Training Program

## 2018-09-29 ENCOUNTER — Encounter: Payer: Self-pay | Admitting: Emergency Medicine

## 2018-09-29 ENCOUNTER — Emergency Department: Payer: Self-pay

## 2018-09-29 ENCOUNTER — Other Ambulatory Visit: Payer: Self-pay

## 2018-09-29 DIAGNOSIS — R110 Nausea: Secondary | ICD-10-CM

## 2018-09-29 DIAGNOSIS — B9689 Other specified bacterial agents as the cause of diseases classified elsewhere: Secondary | ICD-10-CM

## 2018-09-29 DIAGNOSIS — F17210 Nicotine dependence, cigarettes, uncomplicated: Secondary | ICD-10-CM | POA: Insufficient documentation

## 2018-09-29 DIAGNOSIS — E119 Type 2 diabetes mellitus without complications: Secondary | ICD-10-CM | POA: Insufficient documentation

## 2018-09-29 DIAGNOSIS — N760 Acute vaginitis: Secondary | ICD-10-CM | POA: Insufficient documentation

## 2018-09-29 DIAGNOSIS — R1084 Generalized abdominal pain: Secondary | ICD-10-CM

## 2018-09-29 LAB — COMPREHENSIVE METABOLIC PANEL
ALT: 14 U/L (ref 0–44)
AST: 18 U/L (ref 15–41)
Albumin: 4.1 g/dL (ref 3.5–5.0)
Alkaline Phosphatase: 68 U/L (ref 38–126)
Anion gap: 7 (ref 5–15)
BILIRUBIN TOTAL: 0.6 mg/dL (ref 0.3–1.2)
BUN: 13 mg/dL (ref 6–20)
CALCIUM: 8.7 mg/dL — AB (ref 8.9–10.3)
CO2: 26 mmol/L (ref 22–32)
CREATININE: 0.61 mg/dL (ref 0.44–1.00)
Chloride: 105 mmol/L (ref 98–111)
Glucose, Bld: 93 mg/dL (ref 70–99)
Potassium: 3.7 mmol/L (ref 3.5–5.1)
Sodium: 138 mmol/L (ref 135–145)
Total Protein: 7.3 g/dL (ref 6.5–8.1)

## 2018-09-29 LAB — HCG, QUANTITATIVE, PREGNANCY: hCG, Beta Chain, Quant, S: 1 m[IU]/mL (ref <5)

## 2018-09-29 LAB — URINALYSIS, COMPLETE (UACMP) WITH MICROSCOPIC
BILIRUBIN URINE: NEGATIVE
Bacteria, UA: NONE SEEN
Glucose, UA: NEGATIVE mg/dL
Ketones, ur: NEGATIVE mg/dL
Leukocytes, UA: NEGATIVE
Nitrite: NEGATIVE
PH: 6 (ref 5.0–8.0)
Protein, ur: NEGATIVE mg/dL
SPECIFIC GRAVITY, URINE: 1.026 (ref 1.005–1.030)

## 2018-09-29 LAB — POCT PREGNANCY, URINE: PREG TEST UR: NEGATIVE

## 2018-09-29 LAB — WET PREP, GENITAL
Sperm: NONE SEEN
TRICH WET PREP: NONE SEEN
Yeast Wet Prep HPF POC: NONE SEEN

## 2018-09-29 LAB — CHLAMYDIA/NGC RT PCR (ARMC ONLY)
CHLAMYDIA TR: NOT DETECTED
N GONORRHOEAE: NOT DETECTED

## 2018-09-29 LAB — CBC
HEMATOCRIT: 42.5 % (ref 36.0–46.0)
Hemoglobin: 13.2 g/dL (ref 12.0–15.0)
MCH: 27.7 pg (ref 26.0–34.0)
MCHC: 31.1 g/dL (ref 30.0–36.0)
MCV: 89.3 fL (ref 80.0–100.0)
PLATELETS: 306 10*3/uL (ref 150–400)
RBC: 4.76 MIL/uL (ref 3.87–5.11)
RDW: 13.9 % (ref 11.5–15.5)
WBC: 13.9 10*3/uL — ABNORMAL HIGH (ref 4.0–10.5)
nRBC: 0 % (ref 0.0–0.2)

## 2018-09-29 LAB — LIPASE, BLOOD: Lipase: 26 U/L (ref 11–51)

## 2018-09-29 MED ORDER — METRONIDAZOLE 500 MG PO TABS: 500.0000 mg | ORAL_TABLET | Freq: Two times a day (BID) | ORAL | 0 refills | Status: AC

## 2018-09-29 MED ORDER — IOPAMIDOL (ISOVUE-370) INJECTION 76%
125.0000 mL | Freq: Once | INTRAVENOUS | Status: AC | PRN
  Administered 2018-09-29: 125 mL via INTRAVENOUS
  Filled 2018-09-29: qty 125

## 2018-09-29 NOTE — Discharge Instructions (Signed)

## 2018-09-29 NOTE — ED Provider Notes (Signed)
[facility] Emergency Department Provider Note    First MD Initiated Contact with Patient 09/29/18 1641     (approximate)  I have reviewed the triage vital signs and the nursing notes.   HISTORY  Chief Complaint Abdominal Pain    HPI Bonnie Duncan is a [age] y.o. female history of diabetes presents the ER with 2 days worsening generalized abdominal pain particular in the right lower quadrant.  This is been having intermittent abdominal pain for the past 2 months but it became severe last night.  Patient states that she went to public health department and they sent her here to be evaluated for tubal pregnancy.  States her last menstrual cycle was in August.  Denies any vaginal bleeding or discharge at this time.  No dysuria.  No flank pain.  Is not taking anything for the pain.  States she did have some chills at home.  She has difficulty walking secondary to the pain.  Points to the epigastric region when asked where she is hurting.    Past Medical History:  Diagnosis Date  . Diabetes mellitus without complication (HCC)    History reviewed. No pertinent family history. Past Surgical History:  Procedure Laterality Date  . HERNIA REPAIR    . TONSILLECTOMY     Patient Active Problem List   Diagnosis Date Noted  . Obesity 02/04/2011  . Pre-diabetes 02/04/2011  . Essential hypertension, benign 02/04/2011  . Acquired acanthosis nigricans 02/04/2011      Prior to Admission medications   Medication Sig Start Date End Date Taking? Authorizing Provider  cephALEXin (KEFLEX) 500 MG capsule Take 1 capsule (500 mg total) by mouth 3 (three) times daily. 09/01/18   Tommi RumpsSummers, Rhonda L, PA-C  metroNIDAZOLE (FLAGYL) 500 MG tablet Take 1 tablet (500 mg total) by mouth 2 (two) times daily for 7 days. 09/29/18 10/06/18  Willy Eddyobinson, Vallen Calabrese, MD    Allergies Acetaminophen    Social History Social History   Tobacco Use  . Smoking status: Current Every Day Smoker    Packs/day: 0.25    Types: Cigarettes  . Smokeless tobacco: Never Used  Substance Use Topics  . Alcohol use: No  . Drug use: No    Review of Systems Patient denies headaches, rhinorrhea, blurry vision, numbness, shortness of breath, chest pain, edema, cough, abdominal pain, nausea, vomiting, diarrhea, dysuria, fevers, rashes or hallucinations unless otherwise stated above in HPI. ____________________________________________   PHYSICAL EXAM:  VITAL SIGNS: Vitals:   09/29/18 1546  BP: (!) 154/74  Pulse: 99  Resp: 18  Temp: 98.9 F (37.2 C)  SpO2: 98%    Constitutional: Alert and oriented.  Eyes: Conjunctivae are normal.  Head: Atraumatic. Nose: No congestion/rhinnorhea. Mouth/Throat: Mucous membranes are moist.   Neck: No stridor. Painless ROM.  Cardiovascular: Normal rate, regular rhythm. Grossly normal heart sounds.  Good peripheral circulation. Respiratory: Normal respiratory effort.  No retractions. Lungs CTAB. Gastrointestinal: super morbid obesity, ttp in epigastric and ruq region. No distention. No abdominal bruits. No CVA tenderness. Genitourinary: deferred Musculoskeletal: No lower extremity tenderness nor edema.  No joint effusions. Neurologic:  Normal speech and language. No gross focal neurologic deficits are appreciated. No facial droop Skin:  Skin is warm, dry and intact. No rash noted. Psychiatric: Mood and affect are normal. Speech and behavior are normal.  ____________________________________________   LABS (all labs ordered are listed, but only abnormal results are displayed)  Results for orders placed or performed during the hospital encounter of 09/29/18 (from  the past 24 hour(s))  Lipase, blood     Status: None   Collection Time: 09/29/18  3:51 PM  Result Value Ref Range   Lipase 26 11 - 51 U/L  Comprehensive metabolic panel     Status: Abnormal   Collection Time: 09/29/18  3:51 PM  Result Value Ref Range   Sodium 138 135 - 145 mmol/L    Potassium 3.7 3.5 - 5.1 mmol/L   Chloride 105 98 - 111 mmol/L   CO2 26 22 - 32 mmol/L   Glucose, Bld 93 70 - 99 mg/dL   BUN 13 6 - 20 mg/dL   Creatinine, Ser 1.61 0.44 - 1.00 mg/dL   Calcium 8.7 (L) 8.9 - 10.3 mg/dL   Total Protein 7.3 6.5 - 8.1 g/dL   Albumin 4.1 3.5 - 5.0 g/dL   AST 18 15 - 41 U/L   ALT 14 0 - 44 U/L   Alkaline Phosphatase 68 38 - 126 U/L   Total Bilirubin 0.6 0.3 - 1.2 mg/dL   GFR calc non Af Amer >60 >60 mL/min   GFR calc Af Amer >60 >60 mL/min   Anion gap 7 5 - 15  CBC     Status: Abnormal   Collection Time: 09/29/18  3:51 PM  Result Value Ref Range   WBC 13.9 (H) 4.0 - 10.5 K/uL   RBC 4.76 3.87 - 5.11 MIL/uL   Hemoglobin 13.2 12.0 - 15.0 g/dL   HCT 09.6 04.5 - 40.9 %   MCV 89.3 80.0 - 100.0 fL   MCH 27.7 26.0 - 34.0 pg   MCHC 31.1 30.0 - 36.0 g/dL   RDW 81.1 91.4 - 78.2 %   Platelets 306 150 - 400 K/uL   nRBC 0.0 0.0 - 0.2 %  Urinalysis, Complete w Microscopic     Status: Abnormal   Collection Time: 09/29/18  3:51 PM  Result Value Ref Range   Color, Urine YELLOW (A) YELLOW   APPearance HAZY (A) CLEAR   Specific Gravity, Urine 1.026 1.005 - 1.030   pH 6.0 5.0 - 8.0   Glucose, UA NEGATIVE NEGATIVE mg/dL   Hgb urine dipstick MODERATE (A) NEGATIVE   Bilirubin Urine NEGATIVE NEGATIVE   Ketones, ur NEGATIVE NEGATIVE mg/dL   Protein, ur NEGATIVE NEGATIVE mg/dL   Nitrite NEGATIVE NEGATIVE   Leukocytes, UA NEGATIVE NEGATIVE   RBC / HPF 0-5 0 - 5 RBC/hpf   WBC, UA 6-10 0 - 5 WBC/hpf   Bacteria, UA NONE SEEN NONE SEEN   Squamous Epithelial / LPF 6-10 0 - 5   Mucus PRESENT   hCG, quantitative, pregnancy     Status: None   Collection Time: 09/29/18  3:51 PM  Result Value Ref Range   hCG, Beta Chain, Quant, S 1 <5 mIU/mL  Pregnancy, urine POC     Status: None   Collection Time: 09/29/18  3:59 PM  Result Value Ref Range   Preg Test, Ur NEGATIVE NEGATIVE  Wet prep, genital     Status: Abnormal   Collection Time: 09/29/18  6:38 PM  Result Value Ref  Range   Yeast Wet Prep HPF POC NONE SEEN NONE SEEN   Trich, Wet Prep NONE SEEN NONE SEEN   Clue Cells Wet Prep HPF POC PRESENT (A) NONE SEEN   WBC, Wet Prep HPF POC FEW (A) NONE SEEN   Sperm NONE SEEN    ____________________________________________ ____________________________________________  RADIOLOGY  I personally reviewed all radiographic images ordered to evaluate for the  above acute complaints and reviewed radiology reports and findings.  These findings were personally discussed with the patient.  Please see medical record for radiology report.  ____________________________________________   PROCEDURES  Procedure(s) performed:  Procedures    Critical Care performed: no ____________________________________________   INITIAL IMPRESSION / ASSESSMENT AND PLAN / ED COURSE  Pertinent labs & imaging results that were available during my care of the patient were reviewed by me and considered in my medical decision making (see chart for details).   DDX: Enteritis, gastritis, cholelithiasis, cholecystitis, hepatitis, colitis, appendicitis, doubt PID, torsion or cyst  Zyla GAELLE ADRIANCE is a 22 y.o. who presents to the ED with pain as described above.  Exam limited due to the patient's morbid obesity.  Does have mild white count therefore will order CT abdomen to evaluate for the above differential.  Will perform pelvic exam.  Patient is not pregnant.  Doubt torsion.  No bleeding reported.  Clinical Course as of Sep 29 1928  Wed Sep 29, 2018  1836 Pelvic exam is reassuring.  No discharge.  No cervical motion tenderness.  Possible enteritis based on CT scan.  Doubt pelvic inflammatory disease.  Swabs sent to evaluate for infectious process.   [PR]  1926 Prep prep does show positive clue cells.  Doubt PID.  Will be discharged with Flagyl and.  Discussed signs and symptoms for which she should return to the ER and follow-up with PCP.   [PR]    Clinical Course User Index [PR] Willy Eddy, MD     As part of my medical decision making, I reviewed the following data within the electronic MEDICAL RECORD NUMBER Nursing notes reviewed and incorporated, Labs reviewed, notes from prior ED visits  ____________________________________________   FINAL CLINICAL IMPRESSION(S) / ED DIAGNOSES  Final diagnoses:  Generalized abdominal pain  Bacterial vaginosis  Nausea      NEW MEDICATIONS STARTED DURING THIS VISIT:  New Prescriptions   METRONIDAZOLE (FLAGYL) 500 MG TABLET    Take 1 tablet (500 mg total) by mouth 2 (two) times daily for 7 days.     Note:  This document was prepared using Dragon voice recognition software and may include unintentional dictation errors.    Willy Eddy, MD 09/29/18 1929

## 2018-09-29 NOTE — ED Triage Notes (Signed)
Pt presents with abdominal pain x "a couple of months" that has worsened in the last few days. Pt states she went to the health department, where they performed a urine pregnancy test that was negative. She states they told her she should come to the hospital to make sure she does not have an ectopic pregnancy. She also reports that she had a negative urine test but positive blood test for pregnancy. Pt reports last period was in August; states her periods are normally regular. States she is having difficulty walking and lying down due to the pain.

## 2018-09-29 NOTE — ED Notes (Signed)
AAOx3.  Skin warm and dry. NAD.  C/O generalized abdominal pain x 2 months - intermittently.  States pain worse with movement.  Denies emesis / diarrhea.  C/O intermittent nausea with symptoms.

## 2019-03-30 ENCOUNTER — Encounter: Payer: Self-pay | Admitting: *Deleted

## 2019-03-30 ENCOUNTER — Other Ambulatory Visit: Payer: Self-pay

## 2019-03-30 ENCOUNTER — Emergency Department
Admission: EM | Admit: 2019-03-30 | Discharge: 2019-03-31 | Disposition: A | Discharge status: Home / Self Care | Payer: Self-pay | Attending: Emergency Medicine | Admitting: Emergency Medicine

## 2019-03-30 DIAGNOSIS — I10 Essential (primary) hypertension: Secondary | ICD-10-CM | POA: Insufficient documentation

## 2019-03-30 DIAGNOSIS — K8020 Calculus of gallbladder without cholecystitis without obstruction: Secondary | ICD-10-CM | POA: Insufficient documentation

## 2019-03-30 DIAGNOSIS — R102 Pelvic and perineal pain: Secondary | ICD-10-CM | POA: Insufficient documentation

## 2019-03-30 DIAGNOSIS — N760 Acute vaginitis: Secondary | ICD-10-CM | POA: Insufficient documentation

## 2019-03-30 DIAGNOSIS — E119 Type 2 diabetes mellitus without complications: Secondary | ICD-10-CM | POA: Insufficient documentation

## 2019-03-30 DIAGNOSIS — R52 Pain, unspecified: Secondary | ICD-10-CM

## 2019-03-30 DIAGNOSIS — F17210 Nicotine dependence, cigarettes, uncomplicated: Secondary | ICD-10-CM | POA: Insufficient documentation

## 2019-03-30 DIAGNOSIS — B9689 Other specified bacterial agents as the cause of diseases classified elsewhere: Secondary | ICD-10-CM

## 2019-03-30 DIAGNOSIS — R1013 Epigastric pain: Secondary | ICD-10-CM

## 2019-03-30 LAB — URINALYSIS, COMPLETE (UACMP) WITH MICROSCOPIC
Bilirubin Urine: NEGATIVE
Glucose, UA: NEGATIVE mg/dL
Ketones, ur: NEGATIVE mg/dL
Leukocytes,Ua: NEGATIVE
Nitrite: NEGATIVE
Protein, ur: NEGATIVE mg/dL
Specific Gravity, Urine: 1.016 (ref 1.005–1.030)
pH: 6 (ref 5.0–8.0)

## 2019-03-30 LAB — CBC
HCT: 42.8 % (ref 36.0–46.0)
Hemoglobin: 13.5 g/dL (ref 12.0–15.0)
MCH: 28.7 pg (ref 26.0–34.0)
MCHC: 31.5 g/dL (ref 30.0–36.0)
MCV: 91.1 fL (ref 80.0–100.0)
Platelets: 315 10*3/uL (ref 150–400)
RBC: 4.7 MIL/uL (ref 3.87–5.11)
RDW: 13.8 % (ref 11.5–15.5)
WBC: 13 10*3/uL — ABNORMAL HIGH (ref 4.0–10.5)
nRBC: 0 % (ref 0.0–0.2)

## 2019-03-30 LAB — COMPREHENSIVE METABOLIC PANEL
ALT: 16 U/L (ref 0–44)
AST: 14 U/L — ABNORMAL LOW (ref 15–41)
Albumin: 3.9 g/dL (ref 3.5–5.0)
Alkaline Phosphatase: 66 U/L (ref 38–126)
Anion gap: 7 (ref 5–15)
BUN: 10 mg/dL (ref 6–20)
CO2: 25 mmol/L (ref 22–32)
Calcium: 8.7 mg/dL — ABNORMAL LOW (ref 8.9–10.3)
Chloride: 107 mmol/L (ref 98–111)
Creatinine, Ser: 0.65 mg/dL (ref 0.44–1.00)
GFR calc Af Amer: >60 mL/min (ref >60)
GFR calc non Af Amer: >60 mL/min (ref >60)
Glucose, Bld: 105 mg/dL — ABNORMAL HIGH (ref 70–99)
Potassium: 3.5 mmol/L (ref 3.5–5.1)
Sodium: 139 mmol/L (ref 135–145)
Total Bilirubin: 0.3 mg/dL (ref 0.3–1.2)
Total Protein: 7.2 g/dL (ref 6.5–8.1)

## 2019-03-30 LAB — POC URINE PREG, ED: Preg Test, Ur: NEGATIVE

## 2019-03-30 LAB — LIPASE, BLOOD: Lipase: 30 U/L (ref 11–51)

## 2019-03-30 NOTE — ED Provider Notes (Signed)
[facility] Emergency Department Provider Note   ____________________________________________   First MD Initiated Contact with Patient 03/30/19 2315     (approximate)  I have reviewed the triage vital signs and the nursing notes.   HISTORY  Chief Complaint Abdominal Pain and Vaginal Bleeding    HPI Bonnie Duncan is a 23 y.o. female who presents to the ED from home with a chief complaint of epigastric and pelvic pain.  Patient reports pain x3 weeks, now with vaginal bleeding.  Last menstrual.  Approximately 1 month ago.  Patient is sexually active with last sexual intercourse 2 weeks ago.  Denies fever, cough, chest pain, shortness of breath, nausea, vomiting, dysuria, diarrhea.  Food does not exacerbate pain.  Denies recent travel, trauma or exposure to persons diagnosed with coronavirus.       Past Medical History:  Diagnosis Date   Diabetes mellitus without complication [facility])     Patient Active Problem List   Diagnosis Date Noted   Obesity 02/04/2011   Pre-diabetes 02/04/2011   Essential hypertension, benign 02/04/2011   Acquired acanthosis nigricans 02/04/2011    Past Surgical History:  Procedure Laterality Date   HERNIA REPAIR     TONSILLECTOMY      Prior to Admission medications   Medication Sig Start Date End Date Taking? Authorizing Provider  cephALEXin (KEFLEX) 500 MG capsule Take 1 capsule (500 mg total) by mouth 3 (three) times daily. 09/01/18   Johnn Hai, PA-C    Allergies Acetaminophen  History reviewed. No pertinent family history.  Social History Social History   Tobacco Use   Smoking status: Current Every Day Smoker    Packs/day: 0.25    Types: Cigarettes   Smokeless tobacco: Never Used  Substance Use Topics   Alcohol use: No   Drug use: No    Review of Systems  Constitutional: No fever/chills Eyes: No visual changes. ENT: No sore throat. Cardiovascular: Denies chest pain. Respiratory:  Denies shortness of breath. Gastrointestinal: Positive for epigastric abdominal pain.  Positive for pelvic pain.  No nausea, no vomiting.  No diarrhea.  No constipation. Genitourinary: Positive for vaginal bleeding.  Negative for dysuria. Musculoskeletal: Negative for back pain. Skin: Negative for rash. Neurological: Negative for headaches, focal weakness or numbness.   ____________________________________________   PHYSICAL EXAM:  VITAL SIGNS: ED Triage Vitals  Enc Vitals Group     BP 03/30/19 2125 (!) 144/98     Pulse Rate 03/30/19 2125 100     Resp 03/30/19 2125 16     Temp 03/30/19 2125 98.1 F (36.7 C)     Temp Source 03/30/19 2125 Oral     SpO2 03/30/19 2125 99 %     Weight 03/30/19 2121 (!) 340 lb (154.2 kg)     Height 03/30/19 2121 5\' 1"  (1.549 m)     Head Circumference --      Peak Flow --      Pain Score 03/30/19 2121 9     Pain Loc --      Pain Edu? --      Excl. in [place]? --     Constitutional: Alert and oriented. Well appearing and in no acute distress.  Texting on cell phone with a bag of [employer] sitting beside her. Eyes: Conjunctivae are normal. PERRL. EOMI. Head: Atraumatic. Nose: No congestion/rhinnorhea. Mouth/Throat: Mucous membranes are moist.  Oropharynx non-erythematous. Neck: No stridor.   Cardiovascular: Normal rate, regular rhythm. Grossly normal heart sounds.  Good peripheral circulation. Respiratory:  Normal respiratory effort.  No retractions. Lungs CTAB. Gastrointestinal: Morbidly obese.  Soft and mildly tender to epigastrium and pelvis without rebound or guarding. No distention. No abdominal bruits. No CVA tenderness. Musculoskeletal: No lower extremity tenderness nor edema.  No joint effusions. Neurologic:  Normal speech and language. No gross focal neurologic deficits are appreciated. No gait instability. Skin:  Skin is warm, dry and intact. No rash noted. Psychiatric: Mood and affect are normal. Speech and behavior are  normal.  ____________________________________________   LABS (all labs ordered are listed, but only abnormal results are displayed)  Labs Reviewed  WET PREP, GENITAL - Abnormal; Notable for the following components:      Result Value   Clue Cells Wet Prep HPF POC PRESENT (*)    WBC, Wet Prep HPF POC FEW (*)    All other components within normal limits  COMPREHENSIVE METABOLIC PANEL - Abnormal; Notable for the following components:   Glucose, Bld 105 (*)    Calcium 8.7 (*)    AST 14 (*)    All other components within normal limits  CBC - Abnormal; Notable for the following components:   WBC 13.0 (*)    All other components within normal limits  URINALYSIS, COMPLETE (UACMP) WITH MICROSCOPIC - Abnormal; Notable for the following components:   Color, Urine YELLOW (*)    APPearance CLOUDY (*)    Hgb urine dipstick LARGE (*)    Bacteria, UA RARE (*)    All other components within normal limits  CHLAMYDIA/NGC RT PCR ([facility] ONLY)  LIPASE, BLOOD  HCG, QUANTITATIVE, PREGNANCY  POC URINE PREG, ED   ____________________________________________  EKG  None ____________________________________________  RADIOLOGY  ED MD interpretation: Cholelithiasis, unremarkable pelvic ultrasound  Official radiology report(s): [place] Pelvis Transvanginal Non-ob (tv Only)  Result Date: 03/31/2019 CLINICAL DATA:  23 year old female with pelvic pain and vaginal bleeding. LMP 03/10/2019. EXAM: TRANSABDOMINAL AND TRANSVAGINAL ULTRASOUND OF PELVIS DOPPLER ULTRASOUND OF OVARIES TECHNIQUE: Both transabdominal and transvaginal ultrasound examinations of the pelvis were performed. Transabdominal technique was performed for global imaging of the pelvis including uterus, ovaries, adnexal regions, and pelvic cul-de-sac. It was necessary to proceed with endovaginal exam following the transabdominal exam to visualize the ovaries. Color and duplex Doppler ultrasound was utilized to evaluate blood flow to the  ovaries. COMPARISON:  CT Abdomen and Pelvis 09/29/2018. FINDINGS: Uterus Measurements: 6.2 x 3.3 x 5.6 centimeters = volume: 61 mL. No fibroids or other mass visualized. Endometrium Thickness: 6 millimeters. Negative aside from trace fluid in the cervical canal (image 22). Right ovary Measurements: Not visualized despite transabdominal and transvaginal imaging attempts. Left ovary Measurements: 3.3 x 2.0 x 1.7 centimeters = volume: 6 mL. Normal appearance/no adnexal mass. Pulsed Doppler evaluation demonstrates normal low-resistance arterial and venous waveforms in the left ovary. Other findings No abnormal free fluid. IMPRESSION: 1. Normal uterus and left ovary. 2. Right ovary could not be identified. Electronically Signed   By: Odessa FlemingH  Hall M.D.   On: 03/31/2019 01:53   [place] Pelvis Complete  Result Date: 03/31/2019 CLINICAL DATA:  23 year old female with pelvic pain and vaginal bleeding. LMP 03/10/2019. EXAM: TRANSABDOMINAL AND TRANSVAGINAL ULTRASOUND OF PELVIS DOPPLER ULTRASOUND OF OVARIES TECHNIQUE: Both transabdominal and transvaginal ultrasound examinations of the pelvis were performed. Transabdominal technique was performed for global imaging of the pelvis including uterus, ovaries, adnexal regions, and pelvic cul-de-sac. It was necessary to proceed with endovaginal exam following the transabdominal exam to visualize the ovaries. Color and duplex Doppler ultrasound was utilized to evaluate blood flow  to the ovaries. COMPARISON:  CT Abdomen and Pelvis 09/29/2018. FINDINGS: Uterus Measurements: 6.2 x 3.3 x 5.6 centimeters = volume: 61 mL. No fibroids or other mass visualized. Endometrium Thickness: 6 millimeters. Negative aside from trace fluid in the cervical canal (image 22). Right ovary Measurements: Not visualized despite transabdominal and transvaginal imaging attempts. Left ovary Measurements: 3.3 x 2.0 x 1.7 centimeters = volume: 6 mL. Normal appearance/no adnexal mass. Pulsed Doppler evaluation demonstrates  normal low-resistance arterial and venous waveforms in the left ovary. Other findings No abnormal free fluid. IMPRESSION: 1. Normal uterus and left ovary. 2. Right ovary could not be identified. Electronically Signed   By: Odessa FlemingH  Hall M.D.   On: 03/31/2019 01:53   [place] Pelvic Doppler (torsion R/o Or Mass Arterial Flow)  Result Date: 03/31/2019 CLINICAL DATA:  23 year old female with pelvic pain and vaginal bleeding. LMP 03/10/2019. EXAM: TRANSABDOMINAL AND TRANSVAGINAL ULTRASOUND OF PELVIS DOPPLER ULTRASOUND OF OVARIES TECHNIQUE: Both transabdominal and transvaginal ultrasound examinations of the pelvis were performed. Transabdominal technique was performed for global imaging of the pelvis including uterus, ovaries, adnexal regions, and pelvic cul-de-sac. It was necessary to proceed with endovaginal exam following the transabdominal exam to visualize the ovaries. Color and duplex Doppler ultrasound was utilized to evaluate blood flow to the ovaries. COMPARISON:  CT Abdomen and Pelvis 09/29/2018. FINDINGS: Uterus Measurements: 6.2 x 3.3 x 5.6 centimeters = volume: 61 mL. No fibroids or other mass visualized. Endometrium Thickness: 6 millimeters. Negative aside from trace fluid in the cervical canal (image 22). Right ovary Measurements: Not visualized despite transabdominal and transvaginal imaging attempts. Left ovary Measurements: 3.3 x 2.0 x 1.7 centimeters = volume: 6 mL. Normal appearance/no adnexal mass. Pulsed Doppler evaluation demonstrates normal low-resistance arterial and venous waveforms in the left ovary. Other findings No abnormal free fluid. IMPRESSION: 1. Normal uterus and left ovary. 2. Right ovary could not be identified. Electronically Signed   By: Odessa FlemingH  Hall M.D.   On: 03/31/2019 01:53   [place] Abdomen Limited Ruq  Result Date: 03/31/2019 CLINICAL DATA:  Initial evaluation for acute intermittent epigastric pain. EXAM: ULTRASOUND ABDOMEN LIMITED RIGHT UPPER QUADRANT COMPARISON:  Prior CT from 09/29/2018  FINDINGS: Gallbladder: Multiple shadowing stones seen within the gallbladder lumen, largest of which measures approximately 12 mm. Gallbladder wall measures within normal limits at 2.4 mm. No free pericholecystic fluid. No sonographic Murphy sign elicited on exam. Common bile duct: Diameter: 2.6 mm Liver: No focal lesion identified. Within normal limits in parenchymal echogenicity. Portal vein is patent on color Doppler imaging with normal direction of blood flow towards the liver. IMPRESSION: 1. Cholelithiasis without sonographic features for acute cholecystitis. 2. No biliary dilatation. Electronically Signed   By: Rise MuBenjamin  McClintock M.D.   On: 03/31/2019 00:45    ____________________________________________   PROCEDURES  Procedure(s) performed (including Critical Care):  Procedures   ____________________________________________   INITIAL IMPRESSION / ASSESSMENT AND PLAN / ED COURSE  As part of my medical decision making, I reviewed the following data within the electronic MEDICAL RECORD NUMBER Nursing notes reviewed and incorporated, Labs reviewed, Old chart reviewed, Radiograph reviewed and Notes from prior ED visits     Bonnie Duncan was evaluated in Emergency Department on 03/31/2019 for the symptoms described in the history of present illness. She was evaluated in the context of the global COVID-19 pandemic, which necessitated consideration that the patient might be at risk for infection with the SARS-CoV-2 virus that causes COVID-19. Institutional protocols and algorithms that pertain to the evaluation of patients  at risk for COVID-19 are in a state of rapid change based on information released by regulatory bodies including the CDC and federal and state organizations. These policies and algorithms were followed during the patient's care in the ED.   23 year old female who presents with a three week history of epigastric and pelvic pain. Differential diagnosis includes, but is not limited  to, cholelithiasis, pancreatitis, ovarian cyst, ovarian torsion, acute appendicitis, diverticulitis, urinary tract infection/pyelonephritis, endometriosis, bowel obstruction, colitis, renal colic, gastroenteritis, hernia, fibroids, endometriosis, pregnancy related pain including ectopic pregnancy, etc.  Laboratory results unremarkable.  Will proceed with upper abdominal and pelvic ultrasound. Clinical Course as of Mar 30 253  Thu Mar 31, 2019  0253 Rest of patient's work-up and discharge was completed during computer downtime.  Strict return precautions were given.  Patient verbalized understanding and agree with plan of care.   [JS]    Clinical Course User Index [JS] Irean HongSung, Cylee Dattilo J, MD     ____________________________________________   FINAL CLINICAL IMPRESSION(S) / ED DIAGNOSES  Final diagnoses:  Pain  Bacterial vaginosis  Calculus of gallbladder without cholecystitis without obstruction  Epigastric pain  Pelvic pain in female     ED Discharge Orders    None       Note:  This document was prepared using Dragon voice recognition software and may include unintentional dictation errors.   Irean HongSung, Hawley Pavia J, MD 03/31/19 [id number]

## 2019-03-30 NOTE — ED Triage Notes (Signed)
Pt to ED reporting generalized abd and pelvic pain x 3 weeks with vaginal bleeding. Pt is sexually active and denies having taken a pregnancy test. No birth control used. No fevers, NVD.

## 2019-03-31 ENCOUNTER — Emergency Department: Payer: Self-pay

## 2019-03-31 LAB — CHLAMYDIA/NGC RT PCR (ARMC ONLY)
Chlamydia Tr: NOT DETECTED
N gonorrhoeae: NOT DETECTED

## 2019-03-31 LAB — WET PREP, GENITAL
Sperm: NONE SEEN
Trich, Wet Prep: NONE SEEN
Yeast Wet Prep HPF POC: NONE SEEN

## 2019-03-31 LAB — HCG, QUANTITATIVE, PREGNANCY: hCG, Beta Chain, Quant, S: 1 m[IU]/mL

## 2019-04-04 ENCOUNTER — Ambulatory Visit: Payer: Medicaid Other | Admitting: Surgery

## 2019-04-04 ENCOUNTER — Encounter: Payer: Self-pay | Admitting: Surgery

## 2019-12-20 ENCOUNTER — Emergency Department
Admission: EM | Admit: 2019-12-20 | Discharge: 2019-12-20 | Disposition: A | Discharge status: Home / Self Care | Payer: Medicaid Other | Attending: Emergency Medicine | Admitting: Emergency Medicine

## 2019-12-20 ENCOUNTER — Other Ambulatory Visit: Payer: Self-pay

## 2019-12-20 ENCOUNTER — Encounter: Payer: Self-pay | Admitting: Emergency Medicine

## 2019-12-20 DIAGNOSIS — I10 Essential (primary) hypertension: Secondary | ICD-10-CM | POA: Insufficient documentation

## 2019-12-20 DIAGNOSIS — F17210 Nicotine dependence, cigarettes, uncomplicated: Secondary | ICD-10-CM | POA: Insufficient documentation

## 2019-12-20 DIAGNOSIS — R109 Unspecified abdominal pain: Secondary | ICD-10-CM | POA: Insufficient documentation

## 2019-12-20 LAB — CBC
HCT: 47 % — ABNORMAL HIGH (ref 36.0–46.0)
Hemoglobin: 14.4 g/dL (ref 12.0–15.0)
MCH: 28.2 pg (ref 26.0–34.0)
MCHC: 30.6 g/dL (ref 30.0–36.0)
MCV: 92.2 fL (ref 80.0–100.0)
Platelets: 281 10*3/uL (ref 150–400)
RBC: 5.1 MIL/uL (ref 3.87–5.11)
RDW: 13.2 % (ref 11.5–15.5)
WBC: 6.6 10*3/uL (ref 4.0–10.5)
nRBC: 0 % (ref 0.0–0.2)

## 2019-12-20 LAB — URINALYSIS, COMPLETE (UACMP) WITH MICROSCOPIC
Bacteria, UA: NONE SEEN
Bilirubin Urine: NEGATIVE
Glucose, UA: NEGATIVE mg/dL
Hgb urine dipstick: NEGATIVE
Ketones, ur: NEGATIVE mg/dL
Leukocytes,Ua: NEGATIVE
Nitrite: NEGATIVE
Protein, ur: NEGATIVE mg/dL
Specific Gravity, Urine: 1.009 (ref 1.005–1.030)
pH: 6 (ref 5.0–8.0)

## 2019-12-20 LAB — COMPREHENSIVE METABOLIC PANEL
ALT: 17 U/L (ref 0–44)
AST: 16 U/L (ref 15–41)
Albumin: 4.1 g/dL (ref 3.5–5.0)
Alkaline Phosphatase: 71 U/L (ref 38–126)
Anion gap: 9 (ref 5–15)
BUN: 15 mg/dL (ref 6–20)
CO2: 26 mmol/L (ref 22–32)
Calcium: 8.9 mg/dL (ref 8.9–10.3)
Chloride: 104 mmol/L (ref 98–111)
Creatinine, Ser: 0.7 mg/dL (ref 0.44–1.00)
GFR calc Af Amer: >60 mL/min (ref >60)
GFR calc non Af Amer: >60 mL/min (ref >60)
Glucose, Bld: 95 mg/dL (ref 70–99)
Potassium: 4.5 mmol/L (ref 3.5–5.1)
Sodium: 139 mmol/L (ref 135–145)
Total Bilirubin: 0.6 mg/dL (ref 0.3–1.2)
Total Protein: 7.5 g/dL (ref 6.5–8.1)

## 2019-12-20 LAB — LIPASE, BLOOD: Lipase: 23 U/L (ref 11–51)

## 2019-12-20 LAB — POCT PREGNANCY, URINE: Preg Test, Ur: NEGATIVE

## 2019-12-20 MED ORDER — PHENAZOPYRIDINE HCL 200 MG PO TABS
200.0000 mg | ORAL_TABLET | Freq: Once | ORAL | Status: AC
  Administered 2019-12-20: 16:00:00 200 mg via ORAL
  Filled 2019-12-20: qty 1

## 2019-12-20 MED ORDER — SODIUM CHLORIDE 0.9% FLUSH: 3.0000 mL | Freq: Once | INTRAVENOUS | Status: DC

## 2019-12-20 MED ORDER — PHENAZOPYRIDINE HCL 200 MG PO TABS: 200.0000 mg | ORAL_TABLET | Freq: Three times a day (TID) | ORAL | 0 refills | Status: DC | PRN

## 2019-12-20 NOTE — Discharge Instructions (Addendum)
Please seek medical attention for any high fevers, chest pain, shortness of breath, change in behavior, persistent vomiting, bloody stool or any other new or concerning symptoms.  

## 2019-12-20 NOTE — ED Provider Notes (Signed)
[facility] Emergency Department Provider Note   ____________________________________________   I have reviewed the triage vital signs and the nursing notes.   HISTORY  Chief Complaint Abdominal Pain   History limited by: Not Limited   HPI Bonnie Duncan is a 24 y.o. female who presents to the emergency department today because of concern for abdominal pain. The pain is located below her belly button. It started today after she held her urine. She has had similar pain in the past when she has had to hold her urine. The pain started shortly after giving birth to her son in 2019. When the pain started it was greater than 10 however she says it is now around an 8. She says she has not seen anyone for this pain and that her mother was going to get her an appointment with an ob/gyn. The patient denies any change in defecation or any abnormal vaginal discharge.   Records reviewed. Per medical record review patient has a history of emergency department visits in the past for abdominal pain.   Past Medical History:  Diagnosis Date  . Diabetes mellitus without complication [facility])     Patient Active Problem List   Diagnosis Date Noted  . Obesity 02/04/2011  . Pre-diabetes 02/04/2011  . Essential hypertension, benign 02/04/2011  . Acquired acanthosis nigricans 02/04/2011    Past Surgical History:  Procedure Laterality Date  . HERNIA REPAIR    . TONSILLECTOMY      Prior to Admission medications   Medication Sig Start Date End Date Taking? Authorizing Provider  cephALEXin (KEFLEX) 500 MG capsule Take 1 capsule (500 mg total) by mouth 3 (three) times daily. 09/01/18   Johnn Hai, PA-C    Allergies Acetaminophen  No family history on file.  Social History Social History   Tobacco Use  . Smoking status: Current Every Day Smoker    Packs/day: 0.25    Types: Cigarettes  . Smokeless tobacco: Never Used  Substance Use Topics  . Alcohol use: No  .  Drug use: No    Review of Systems Constitutional: No fever/chills Eyes: No visual changes. ENT: No sore throat. Cardiovascular: Denies chest pain. Respiratory: Denies shortness of breath. Gastrointestinal: Positive for abdominal pain. Genitourinary: Negative for dysuria. Musculoskeletal: Negative for back pain. Skin: Negative for rash. Neurological: Negative for headaches, focal weakness or numbness.  ____________________________________________   PHYSICAL EXAM:  VITAL SIGNS: ED Triage Vitals  Enc Vitals Group     BP 12/20/19 1445 (!) 150/87     Pulse Rate 12/20/19 1445 84     Resp 12/20/19 1445 20     Temp 12/20/19 1445 98.4 F (36.9 C)     Temp Source 12/20/19 1445 Oral     SpO2 12/20/19 1445 100 %     Weight 12/20/19 1446 (!) 342 lb (155.1 kg)     Height 12/20/19 1446 5\' 1"  (1.549 m)     Head Circumference --      Peak Flow --      Pain Score 12/20/19 1451 9   Constitutional: Alert and oriented.  Eyes: Conjunctivae are normal.  ENT      Head: Normocephalic and atraumatic.      Nose: No congestion/rhinnorhea.      Mouth/Throat: Mucous membranes are moist.      Neck: No stridor. Cardiovascular: Normal rate, regular rhythm.  No murmurs, rubs, or gallops.  Respiratory: Normal respiratory effort without tachypnea nor retractions. Breath sounds are clear and equal bilaterally.  No wheezes/rales/rhonchi. Gastrointestinal: Soft and non tender. No rebound. No guarding.  Genitourinary: Deferred Musculoskeletal: Normal range of motion in all extremities.  Neurologic:  Normal speech and language. No gross focal neurologic deficits are appreciated.  Skin:  Skin is warm, dry and intact. No rash noted. Psychiatric: Mood and affect are normal. Speech and behavior are normal. Patient exhibits appropriate insight and judgment.  ____________________________________________    LABS (pertinent positives/negatives)  Upreg negative CMP wnl UA clear, unremarkable Lipase 23 CBC  wbc 6.6, hgb 14.4, plt 281  ____________________________________________   EKG  None  ____________________________________________    RADIOLOGY  None  ____________________________________________   PROCEDURES  Procedures  ____________________________________________   INITIAL IMPRESSION / ASSESSMENT AND PLAN / ED COURSE  Pertinent labs & imaging results that were available during my care of the patient were reviewed by me and considered in my medical decision making (see chart for details).   Patient presented to the emergency department today because of concern for abdominal pain after holding her urine. She states that she has had this happen to her in the past. Blood work without concerning leukocytosis. UA not consistent with infection. She did get relief with pyridium. Do wonder if she simply stretched her bladder. At this time do not think any emergent imaging is necessary. Will discharge with prescription for pyridium.  ____________________________________________   FINAL CLINICAL IMPRESSION(S) / ED DIAGNOSES  Final diagnoses:  Abdominal pain, unspecified abdominal location     Note: This dictation was prepared with Dragon dictation. Any transcriptional errors that result from this process are unintentional     Phineas Semen, MD 12/20/19 1710

## 2019-12-20 NOTE — ED Triage Notes (Signed)
C/O low abdominal pain. States pain started this morning after 'holding her pee while her mother-in-law was in the bathroom'.  AAOx3.  Skin warm and dry. NAD

## 2019-12-20 NOTE — ED Notes (Signed)
EDP, Goodman to bedside. 

## 2020-10-31 ENCOUNTER — Emergency Department
Admission: EM | Admit: 2020-10-31 | Discharge: 2020-10-31 | Disposition: A | Discharge status: Home / Self Care | Payer: Self-pay | Attending: Emergency Medicine | Admitting: Emergency Medicine

## 2020-10-31 ENCOUNTER — Emergency Department: Payer: Self-pay

## 2020-10-31 ENCOUNTER — Other Ambulatory Visit: Payer: Self-pay

## 2020-10-31 ENCOUNTER — Encounter: Payer: Self-pay | Admitting: Emergency Medicine

## 2020-10-31 DIAGNOSIS — F17210 Nicotine dependence, cigarettes, uncomplicated: Secondary | ICD-10-CM | POA: Insufficient documentation

## 2020-10-31 DIAGNOSIS — I10 Essential (primary) hypertension: Secondary | ICD-10-CM | POA: Insufficient documentation

## 2020-10-31 DIAGNOSIS — Z20822 Contact with and (suspected) exposure to covid-19: Secondary | ICD-10-CM | POA: Insufficient documentation

## 2020-10-31 DIAGNOSIS — Z1152 Encounter for screening for COVID-19: Secondary | ICD-10-CM

## 2020-10-31 DIAGNOSIS — J069 Acute upper respiratory infection, unspecified: Secondary | ICD-10-CM | POA: Insufficient documentation

## 2020-10-31 DIAGNOSIS — E119 Type 2 diabetes mellitus without complications: Secondary | ICD-10-CM | POA: Insufficient documentation

## 2020-10-31 LAB — SARS CORONAVIRUS 2 (TAT 6-24 HRS): SARS Coronavirus 2: NEGATIVE

## 2020-10-31 MED ORDER — BENZONATATE 100 MG PO CAPS: 100.0000 mg | ORAL_CAPSULE | Freq: Three times a day (TID) | ORAL | 0 refills | Status: AC | PRN

## 2020-10-31 NOTE — ED Triage Notes (Signed)
C/O sinus congestion and intermittent sore throat x 1 week.   AAOx3.  Skin warm and dry. NAD

## 2020-10-31 NOTE — Discharge Instructions (Addendum)
Follow-up with your primary care provider if any continued problems or concerns.  Also your COVID and influenza test should result in the next 6 to 24 hours.  This information can be seen in MyChart.  A prescription for Jerilynn Som was sent to your pharmacy.  This medication has very little side effects and will not cause any difficulty with your blood pressure.  Continue to drink fluids frequently and take Tylenol or ibuprofen as needed.  If your test are negative you may continue your regular routine.  If your COVID or influenza test is positive you will need to quarantine at home.  Continue wearing your mask.  Also make any contacts that you have had aware that you are positive.  Return to the emergency department if any severe worsening of your symptoms, difficulty breathing or shortness of breath.

## 2020-10-31 NOTE — ED Provider Notes (Signed)
[facility] Emergency Department Provider Note  ____________________________________________   Event Date/Time   First MD Initiated Contact with Patient 10/31/20 1343     (approximate)  I have reviewed the triage vital signs and the nursing notes.   HISTORY  Chief Complaint URI   HPI Bonnie Duncan is a 25 y.o. female presents to the ED with complaint of sinus congestion, sore throat and intermittent cough for approximately 1 week.  Patient has had a subjective fever and occasional cough.  She has tried over-the-counter medication but states that she felt that the decongestant caused her heart to "flutter".  Patient has experienced some diarrhea initially.  Currently she denies any nausea, vomiting or diarrhea.  Patient is currently a smoker stopped 2 days ago, unvaccinated.  She rates her pain as a 0/10.     Past Medical History:  Diagnosis Date  . Diabetes mellitus without complication [facility])     Patient Active Problem List   Diagnosis Date Noted  . Obesity 02/04/2011  . Pre-diabetes 02/04/2011  . Essential hypertension, benign 02/04/2011  . Acquired acanthosis nigricans 02/04/2011    Past Surgical History:  Procedure Laterality Date  . HERNIA REPAIR    . TONSILLECTOMY      Prior to Admission medications   Medication Sig Start Date End Date Taking? Authorizing Provider  benzonatate (TESSALON PERLES) 100 MG capsule Take 1 capsule (100 mg total) by mouth 3 (three) times daily as needed for cough. 10/31/20 10/31/21 Yes Tommi Rumps, PA-C    Allergies Acetaminophen  No family history on file.  Social History Social History   Tobacco Use  . Smoking status: Current Every Day Smoker    Packs/day: 0.25    Types: Cigarettes  . Smokeless tobacco: Never Used  Substance Use Topics  . Alcohol use: No  . Drug use: No    Review of Systems Constitutional: No fever/chills Eyes: No visual changes. ENT: Positive sore throat.  Positive nasal  congestion. Cardiovascular: Denies chest pain. Respiratory: Denies shortness of breath. Gastrointestinal: No abdominal pain.  No nausea, no vomiting.  No diarrhea.  No constipation. Genitourinary: Negative for dysuria. Musculoskeletal: Negative for back pain. Skin: Negative for rash. Neurological: Negative for headaches, focal weakness or numbness. ____________________________________________   PHYSICAL EXAM:  VITAL SIGNS: ED Triage Vitals  Enc Vitals Group     BP 10/31/20 1300 136/84     Pulse Rate 10/31/20 1300 75     Resp 10/31/20 1300 18     Temp 10/31/20 1300 98.5 F (36.9 C)     Temp Source 10/31/20 1300 Oral     SpO2 10/31/20 1300 95 %     Weight 10/31/20 1252 (!) 341 lb 14.9 oz (155.1 kg)     Height 10/31/20 1252 5\' 1"  (1.549 m)     Head Circumference --      Peak Flow --      Pain Score 10/31/20 1252 0     Pain Loc --      Pain Edu? --      Excl. in GC? --     Constitutional: Alert and oriented. Well appearing and in no acute distress. Eyes: Conjunctivae are normal.  Head: Atraumatic. Nose: Positive congestion/no current rhinnorhea. Mouth/Throat: Mucous membranes are moist.  Oropharynx non-erythematous.  No exudate and uvula is midline. Neck: No stridor.   Cardiovascular: Normal rate, regular rhythm. Grossly normal heart sounds.  Good peripheral circulation. Respiratory: Normal respiratory effort.  No retractions. Lungs CTAB. Gastrointestinal: Soft  and nontender. No distention.  Bowel sounds normoactive x4 quadrants. Musculoskeletal: Moves upper and lower extremities without any difficulty.  Normal gait was noted. Neurologic:  Normal speech and language. No gross focal neurologic deficits are appreciated. No gait instability. Skin:  Skin is warm, dry and intact. No rash noted. Psychiatric: Mood and affect are normal. Speech and behavior are normal.  ____________________________________________   LABS (all labs ordered are listed, but only abnormal results  are displayed)  Labs Reviewed  SARS CORONAVIRUS 2 (TAT 6-24 HRS)     RADIOLOGY I, Tommi Rumps, personally viewed and evaluated these images (plain radiographs) as part of my medical decision making, as well as reviewing the written report by the radiologist.   Official radiology report(s): DG Chest Port 1 View  Result Date: 10/31/2020 CLINICAL DATA:  Cough, smoker EXAM: PORTABLE CHEST 1 VIEW COMPARISON:  06/05/2016 FINDINGS: Evaluation limited by body habitus and portable technique. The heart size and mediastinal contours are within normal limits for technique. Both lungs are clear. No pleural effusion or pneumothorax. The visualized skeletal structures are unremarkable. IMPRESSION: No acute process in the chest. Electronically Signed   By: Guadlupe Spanish M.D.   On: 10/31/2020 14:30    ____________________________________________   PROCEDURES  Procedure(s) performed (including Critical Care):  Procedures   ____________________________________________   INITIAL IMPRESSION / ASSESSMENT AND PLAN / ED COURSE  As part of my medical decision making, I reviewed the following data within the electronic MEDICAL RECORD NUMBER Notes from prior ED visits and [place] Controlled Substance Database  25 year old female presents to the ED with complaint of sinus congestion, sore throat, cough for approximately 1 week.  Patient is a smoker and on vaccinated.  She complains of a subjective fever.  She has taken over-the-counter medication without any provement.  Physical exam shows upper respiratory congestion without wheezing noted.  Chest x-ray was negative for any acute cardiopulmonary changes.  Patient was discharged with pending results on her COVID and influenza swab.  She is aware that it can take 6 to 24 hours to result.  She is to get the results of her test on MyChart.  A prescription for Tessalon Perles was sent to her pharmacy and she is encouraged to fluids and ibuprofen or Tylenol as  needed. ____________________________________________   FINAL CLINICAL IMPRESSION(S) / ED DIAGNOSES  Final diagnoses:  Viral URI with cough  Encounter for screening for COVID-19     ED Discharge Orders         Ordered    benzonatate (TESSALON PERLES) 100 MG capsule  3 times daily PRN        10/31/20 1506          *Please note:  Johna Sheriff was evaluated in Emergency Department on 10/31/2020 for the symptoms described in the history of present illness. She was evaluated in the context of the global COVID-19 pandemic, which necessitated consideration that the patient might be at risk for infection with the SARS-CoV-2 virus that causes COVID-19. Institutional protocols and algorithms that pertain to the evaluation of patients at risk for COVID-19 are in a state of rapid change based on information released by regulatory bodies including the CDC and federal and state organizations. These policies and algorithms were followed during the patient's care in the ED.  Some ED evaluations and interventions may be delayed as a result of limited staffing during and the pandemic.*   Note:  This document was prepared using Dragon voice recognition software and may include  unintentional dictation errors.    Tommi Rumps, PA-C 10/31/20 1519    Gilles Chiquito, MD 10/31/20 [phone]

## 2020-12-19 ENCOUNTER — Emergency Department
Admission: EM | Admit: 2020-12-19 | Discharge: 2020-12-19 | Disposition: A | Discharge status: Home / Self Care | Payer: Medicaid Other | Attending: Emergency Medicine | Admitting: Emergency Medicine

## 2020-12-19 ENCOUNTER — Other Ambulatory Visit: Payer: Self-pay

## 2020-12-19 DIAGNOSIS — R112 Nausea with vomiting, unspecified: Secondary | ICD-10-CM | POA: Insufficient documentation

## 2020-12-19 DIAGNOSIS — I10 Essential (primary) hypertension: Secondary | ICD-10-CM | POA: Insufficient documentation

## 2020-12-19 DIAGNOSIS — R1013 Epigastric pain: Secondary | ICD-10-CM | POA: Insufficient documentation

## 2020-12-19 DIAGNOSIS — E119 Type 2 diabetes mellitus without complications: Secondary | ICD-10-CM | POA: Insufficient documentation

## 2020-12-19 DIAGNOSIS — F17210 Nicotine dependence, cigarettes, uncomplicated: Secondary | ICD-10-CM | POA: Insufficient documentation

## 2020-12-19 LAB — COMPREHENSIVE METABOLIC PANEL
ALT: 19 U/L (ref 0–44)
AST: 16 U/L (ref 15–41)
Albumin: 4 g/dL (ref 3.5–5.0)
Alkaline Phosphatase: 62 U/L (ref 38–126)
Anion gap: 7 (ref 5–15)
BUN: 8 mg/dL (ref 6–20)
CO2: 24 mmol/L (ref 22–32)
Calcium: 8.8 mg/dL — ABNORMAL LOW (ref 8.9–10.3)
Chloride: 107 mmol/L (ref 98–111)
Creatinine, Ser: 0.65 mg/dL (ref 0.44–1.00)
GFR, Estimated: >60 mL/min (ref >60)
Glucose, Bld: 87 mg/dL (ref 70–99)
Potassium: 4.2 mmol/L (ref 3.5–5.1)
Sodium: 138 mmol/L (ref 135–145)
Total Bilirubin: 0.7 mg/dL (ref 0.3–1.2)
Total Protein: 7.4 g/dL (ref 6.5–8.1)

## 2020-12-19 LAB — CBC
HCT: 43.6 % (ref 36.0–46.0)
Hemoglobin: 13.5 g/dL (ref 12.0–15.0)
MCH: 28.4 pg (ref 26.0–34.0)
MCHC: 31 g/dL (ref 30.0–36.0)
MCV: 91.6 fL (ref 80.0–100.0)
Platelets: 291 10*3/uL (ref 150–400)
RBC: 4.76 MIL/uL (ref 3.87–5.11)
RDW: 13.9 % (ref 11.5–15.5)
WBC: 9.7 10*3/uL (ref 4.0–10.5)
nRBC: 0 % (ref 0.0–0.2)

## 2020-12-19 LAB — LIPASE, BLOOD: Lipase: 26 U/L (ref 11–51)

## 2020-12-19 MED ORDER — ONDANSETRON 4 MG PO TBDP: 4.0000 mg | ORAL_TABLET | Freq: Three times a day (TID) | ORAL | 0 refills | Status: AC | PRN

## 2020-12-19 MED ORDER — ONDANSETRON 8 MG PO TBDP
8.0000 mg | ORAL_TABLET | Freq: Once | ORAL | Status: AC
  Administered 2020-12-19: 8 mg via ORAL
  Filled 2020-12-19: qty 1

## 2020-12-19 NOTE — ED Triage Notes (Signed)
Pt comes with c/o abdominal pain that started yesterday. Pt states some N/V.

## 2020-12-19 NOTE — ED Provider Notes (Signed)
[facility] Emergency Department Provider Note   ____________________________________________   Event Date/Time   First MD Initiated Contact with Patient 12/19/20 1557     (approximate)  I have reviewed the triage vital signs and the nursing notes.   HISTORY  Chief Complaint Abdominal Pain    HPI Bonnie Duncan is a 25 y.o. female with a stated past medical history of type 2 diabetes who presents for nausea/vomiting/midepigastric abdominal pain that began 24 hours prior to arrival and has remained steady since onset.  Patient describes an aching/burning midepigastric abdominal pain that is partially relieved with vomiting.  Patient denies any recent sick contacts.  Patient denies any radiation of this pain.  Patient denies any recent travel or food out of the ordinary.  Patient currently denies any vision changes, tinnitus, difficulty speaking, facial droop, sore throat, chest pain, shortness of breath, diarrhea, dysuria, or weakness/numbness/paresthesias in any extremity         Past Medical History:  Diagnosis Date  . Diabetes mellitus without complication [facility])     Patient Active Problem List   Diagnosis Date Noted  . Obesity 02/04/2011  . Pre-diabetes 02/04/2011  . Essential hypertension, benign 02/04/2011  . Acquired acanthosis nigricans 02/04/2011    Past Surgical History:  Procedure Laterality Date  . HERNIA REPAIR    . TONSILLECTOMY      Prior to Admission medications   Medication Sig Start Date End Date Taking? Authorizing Provider  ondansetron (ZOFRAN ODT) 4 MG disintegrating tablet Take 1 tablet (4 mg total) by mouth every 8 (eight) hours as needed for nausea or vomiting. 12/19/20  Yes Merwyn Katos, MD  benzonatate (TESSALON PERLES) 100 MG capsule Take 1 capsule (100 mg total) by mouth 3 (three) times daily as needed for cough. 10/31/20 10/31/21  Tommi Rumps, PA-C    Allergies Acetaminophen  No family history on  file.  Social History Social History   Tobacco Use  . Smoking status: Current Every Day Smoker    Packs/day: 0.25    Types: Cigarettes  . Smokeless tobacco: Never Used  Substance Use Topics  . Alcohol use: No  . Drug use: No    Review of Systems Constitutional: No fever/chills Eyes: No visual changes. ENT: No sore throat. Cardiovascular: Denies chest pain. Respiratory: Denies shortness of breath. Gastrointestinal: Abdominal pain, nausea, and vomiting.  No diarrhea. Genitourinary: Negative for dysuria. Musculoskeletal: Negative for acute arthralgias Skin: Negative for rash. Neurological: Negative for headaches, weakness/numbness/paresthesias in any extremity Psychiatric: Negative for suicidal ideation/homicidal ideation   ____________________________________________   PHYSICAL EXAM:  VITAL SIGNS: ED Triage Vitals  Enc Vitals Group     BP 12/19/20 1417 131/64     Pulse Rate 12/19/20 1417 75     Resp 12/19/20 1417 (!) 22     Temp 12/19/20 1417 98.2 F (36.8 C)     Temp Source 12/19/20 1417 Oral     SpO2 12/19/20 1417 97 %     Weight --      Height --      Head Circumference --      Peak Flow --      Pain Score 12/19/20 1427 10     Pain Loc --      Pain Edu? --      Excl. in GC? --    Constitutional: Alert and oriented. Well appearing morbidly obese female in no acute distress. Eyes: Conjunctivae are normal. PERRL. Head: Atraumatic. Nose: No congestion/rhinnorhea. Mouth/Throat: Mucous membranes  are moist. Neck: No stridor Cardiovascular: Grossly normal heart sounds.  Good peripheral circulation. Respiratory: Normal respiratory effort.  No retractions. Gastrointestinal: Soft and nontender. No distention. Musculoskeletal: No obvious deformities Neurologic:  Normal speech and language. No gross focal neurologic deficits are appreciated. Skin:  Skin is warm and dry. No rash noted. Psychiatric: Mood and affect are normal. Speech and behavior are  normal.  ____________________________________________   LABS (all labs ordered are listed, but only abnormal results are displayed)  Labs Reviewed  COMPREHENSIVE METABOLIC PANEL - Abnormal; Notable for the following components:      Result Value   Calcium 8.8 (*)    All other components within normal limits  LIPASE, BLOOD  CBC  URINALYSIS, COMPLETE (UACMP) WITH MICROSCOPIC  POC URINE PREG, ED    PROCEDURES  Procedure(s) performed (including Critical Care):  .1-3 Lead EKG Interpretation Performed by: Merwyn Katos, MD Authorized by: Merwyn Katos, MD     Interpretation: normal     ECG rate:  86   ECG rate assessment: normal     Rhythm: sinus rhythm     Ectopy: none     Conduction: normal       ____________________________________________   INITIAL IMPRESSION / ASSESSMENT AND PLAN / ED COURSE  As part of my medical decision making, I reviewed the following data within the electronic MEDICAL RECORD NUMBER Nursing notes reviewed and incorporated, Labs reviewed, Old chart reviewed, and Notes from prior ED visits reviewed and incorporated        Patient presents for acute nausea/vomiting The cause of the patients symptoms is not clear, but the patient is overall well appearing and is suspected to have a transient course of illness.  Given History and Exam there does not appear to be an emergent cause of the symptoms such as small bowel obstruction, coronary syndrome, bowel ischemia, DKA, pancreatitis, appendicitis, other acute abdomen or other emergent problem.  Reassessment: After treatment, the patient is feeling much better, tolerating PO fluids, and shows no signs of dehydration.  Rx: Zofran Disposition: Discharge home with prompt primary care physician follow up in the next 48 hours. Strict return precautions discussed.      ____________________________________________   FINAL CLINICAL IMPRESSION(S) / ED DIAGNOSES  Final diagnoses:  Epigastric pain   Non-intractable vomiting with nausea, unspecified vomiting type     ED Discharge Orders         Ordered    ondansetron (ZOFRAN ODT) 4 MG disintegrating tablet  Every 8 hours PRN        12/19/20 1658           Note:  This document was prepared using Dragon voice recognition software and may include unintentional dictation errors.   Merwyn Katos, MD 12/19/20 1728

## 2021-10-22 ENCOUNTER — Emergency Department: Payer: Medicaid Other

## 2021-10-22 ENCOUNTER — Emergency Department
Admission: EM | Admit: 2021-10-22 | Discharge: 2021-10-22 | Disposition: A | Discharge status: Home / Self Care | Payer: Medicaid Other | Attending: Emergency Medicine | Admitting: Emergency Medicine

## 2021-10-22 ENCOUNTER — Encounter: Payer: Self-pay | Admitting: Emergency Medicine

## 2021-10-22 ENCOUNTER — Other Ambulatory Visit: Payer: Self-pay

## 2021-10-22 DIAGNOSIS — I10 Essential (primary) hypertension: Secondary | ICD-10-CM | POA: Insufficient documentation

## 2021-10-22 DIAGNOSIS — U071 COVID-19: Secondary | ICD-10-CM | POA: Insufficient documentation

## 2021-10-22 NOTE — ED Triage Notes (Signed)
Pt to ED from home c/o productive yellow cough, chills, body aches, loss of taste and smell for the last 6 days.  States someone in household tested positive for COVID this past Friday.  States took two COVID tests at home with first one coming back positive and second one being negative.  States pain to chest wall with coughing and deep breaths.  Pt A&Ox4, chest rise even and unlabored, skin WNL and in NAD at this time.

## 2021-10-22 NOTE — ED Provider Notes (Signed)
[facility] Provider Note    None    (approximate)   History   Chief Complaint Chills, Generalized Body Aches, and Nasal Congestion   HPI  Bonnie Duncan is a 26 y.o. female, history of hypertension, prediabetes, and obesity, presents emergency department for evaluation of flulike symptoms.  Patient endorses yellow cough, chills, body aches, loss of taste and smell, for the last 6 days.  She states that someone in the household tested positive for COVID this past Friday.  She says she has taken 2 COVID tests at home and one of them came back positive.  Additionally endorses some chest wall tightness while coughing.  Denies shortness of breath, abdominal pain, back pain, flank pain, nausea/vomiting, or urinary symptoms.  History Limitations: No limitations      Physical Exam  Triage Vital Signs: ED Triage Vitals  Enc Vitals Group     BP 10/22/21 1952 126/86     Pulse Rate 10/22/21 1952 82     Resp 10/22/21 1952 16     Temp 10/22/21 1952 98.2 F (36.8 C)     Temp Source 10/22/21 1952 Oral     SpO2 10/22/21 1952 96 %     Weight 10/22/21 1953 (!) 330 lb (149.7 kg)     Height 10/22/21 1953 5\' 1"  (1.549 m)     Head Circumference --      Peak Flow --      Pain Score 10/22/21 1953 6     Pain Loc --      Pain Edu? --      Excl. in GC? --     Most recent vital signs: Vitals:   10/22/21 1952  BP: 126/86  Pulse: 82  Resp: 16  Temp: 98.2 F (36.8 C)  SpO2: 96%     Physical Exam Constitutional:      General: She is not in acute distress.    Appearance: Normal appearance. She is not ill-appearing.  Pulmonary:     Effort: Pulmonary effort is normal. No respiratory distress.     Breath sounds: No stridor. No wheezing, rhonchi or rales.  Chest:     Chest wall: No tenderness.  Abdominal:     General: Abdomen is flat.     Palpations: Abdomen is soft.     Tenderness: There is no abdominal tenderness.  Skin:    General: Skin is warm and dry.      Capillary Refill: Capillary refill takes less than 2 seconds.  Neurological:     Mental Status: She is alert. Mental status is at baseline.      ED Results / Procedures / Treatments  Labs (all labs ordered are listed, but only abnormal results are displayed) Labs Reviewed - No data to display    EKG Not applicable   RADIOLOGY I personally viewed and evaluated these images as part of my medical decision making, as well as reviewing the written report by the radiologist.  ED Provider Interpretation: I agree with the interpretation of the radiologist.  No acute chest findings.   PROCEDURES:  Critical Care performed: None.  Procedures   MEDICATIONS ORDERED IN ED: Medications - No data to display   IMPRESSION / MDM / ASSESSMENT AND PLAN / ED COURSE  I reviewed the triage vital signs and the nursing notes.  Bonnie Duncan is a 26 y.o. female history of hypertension, prediabetes, and obesity, presents emergency department for evaluation of flulike symptoms.  Patient endorses yellow cough, chills, body aches, loss of taste and smell, for the last 6 days.  She states that someone in the household tested positive for COVID this past Friday.  She says she has taken 2 COVID tests at home and one of them came back positive.  Additionally endorses some chest wall tightness while coughing.  Denies shortness of breath, abdominal pain, back pain, flank pain, nausea/vomiting, or urinary symptoms.  Differential diagnosis includes, but is not limited to, COVID-19, influenza, bronchitis, pneumonia, ACS, strep throat, mono.  Patient appears well.  She is sitting upright in her chair comfortably.  NAD.  Physical exam is overall unremarkable.  Lung sounds are clear bilaterally in the apices and bases.  Vital signs are within normal limits.  Respiratory panel is pending.  Chest x-ray shows no focal consolidations suggesting pneumonia or any other cardiopulmonary  disease.  Given the patient's history, physical exam, and work-up so far, I suspect that the patient likely has COVID-19.  Considered antiviral therapy, but given that the patient is young and does not have any major comorbidities, will forego treatment at this time.  Patient was discharged with a work note.  Encouraged her to manage symptoms at home with Tylenol/ibuprofen and other over-the-counter medications as needed.  Patient was provided with anticipatory guidance, return precautions, and educational material. Encouraged the patient to return to the emergency department at any time if they begin to experience any new or worsening symptoms.       FINAL CLINICAL IMPRESSION(S) / ED DIAGNOSES   Final diagnoses:  COVID     Rx / DC Orders   ED Discharge Orders     None        Note:  This document was prepared using Dragon voice recognition software and may include unintentional dictation errors.   Varney Daily, [place] 10/23/21 6387    Phineas Semen, MD 10/23/21 [phone]

## 2021-10-22 NOTE — Discharge Instructions (Signed)
-  Please return to the emergency department anytime if you begin to experience any new or worsening symptoms. -Take Tylenol/ibuprofen and other over-the-counter medications as needed to manage symptoms.

## 2021-10-22 NOTE — ED Notes (Signed)
Pt seen and d/c by provider in triage °

## 2022-09-05 ENCOUNTER — Emergency Department
Admission: EM | Admit: 2022-09-05 | Discharge: 2022-09-05 | Disposition: A | Discharge status: Home / Self Care | Payer: Medicaid Other | Attending: Emergency Medicine | Admitting: Emergency Medicine

## 2022-09-05 ENCOUNTER — Emergency Department: Payer: Medicaid Other

## 2022-09-05 ENCOUNTER — Other Ambulatory Visit: Payer: Self-pay

## 2022-09-05 DIAGNOSIS — J069 Acute upper respiratory infection, unspecified: Secondary | ICD-10-CM | POA: Insufficient documentation

## 2022-09-05 DIAGNOSIS — U071 COVID-19: Secondary | ICD-10-CM | POA: Insufficient documentation

## 2022-09-05 LAB — CBG MONITORING, ED: Glucose-Capillary: 94 mg/dL (ref 70–99)

## 2022-09-05 LAB — SARS CORONAVIRUS 2 BY RT PCR: SARS Coronavirus 2 by RT PCR: POSITIVE — AB

## 2022-09-05 MED ORDER — AMOXICILLIN 875 MG PO TABS: 875.0000 mg | ORAL_TABLET | Freq: Two times a day (BID) | ORAL | 0 refills | Status: AC

## 2022-09-05 NOTE — ED Triage Notes (Signed)
Pt presents via POV c/o cough, congestion, loss of taste sensation, and cold chills x1 week.

## 2022-09-05 NOTE — ED Provider Notes (Signed)
[facility] Provider Note    Event Date/Time   First MD Initiated Contact with Patient 09/05/22 1343     (approximate)   History   Nasal Congestion and Cough   HPI  Bonnie Duncan is a 26 y.o. female with history of diabetes presents emergency department with cough and congestion loss of taste and some sensation of mild cold chills for 1 week.  No vomiting or diarrhea.      Physical Exam   Triage Vital Signs: ED Triage Vitals  Enc Vitals Group     BP 09/05/22 1346 132/89     Pulse Rate 09/05/22 1345 88     Resp 09/05/22 1345 18     Temp 09/05/22 1345 98 F (36.7 C)     Temp Source 09/05/22 1345 Oral     SpO2 09/05/22 1345 92 %     Weight 09/05/22 1340 (!) 355 lb (161 kg)     Height 09/05/22 1340 5\' 1"  (1.549 m)     Head Circumference --      Peak Flow --      Pain Score 09/05/22 1340 8     Pain Loc --      Pain Edu? --      Excl. in GC? --     Most recent vital signs: Vitals:   09/05/22 1345 09/05/22 1346  BP:  132/89  Pulse: 88   Resp: 18   Temp: 98 F (36.7 C)   SpO2: 92%      General: Awake, no distress.   CV:  Good peripheral perfusion. regular rate and  rhythm Resp:  Normal effort. Lungs cta Abd:  No distention.   Other:   Ent normal   ED Results / Procedures / Treatments   Labs (all labs ordered are listed, but only abnormal results are displayed) Labs Reviewed  SARS CORONAVIRUS 2 BY RT PCR - Abnormal; Notable for the following components:      Result Value   SARS Coronavirus 2 by RT PCR POSITIVE (*)    All other components within normal limits  CBG MONITORING, ED     EKG     RADIOLOGY cxr    PROCEDURES:   Procedures   MEDICATIONS ORDERED IN ED: Medications - No data to display   IMPRESSION / MDM / ASSESSMENT AND PLAN / ED COURSE  I reviewed the triage vital signs and the nursing notes.                              Differential diagnosis includes, but is not limited to, COVID, influenza,  CAP, acute bronchitis  Patient's presentation is most consistent with acute complicated illness / injury requiring diagnostic workup.   COVID test, chest x-ray, will also do CBGs patient is diabetic and has not checked her diabetes recently  Chest x-ray independently reviewed and interpreted by me as being negative, respiratory panel is positive for COVID  I did explain findings to the patient.  She been discharged with prescription for amoxicillin for sinus infection, so I did call her to notify her of the COVID-positive results.  She is to quarantine at home.  Patient is in agreement treatment plan.  Discharged stable condition.     FINAL CLINICAL IMPRESSION(S) / ED DIAGNOSES   Final diagnoses:  Acute URI  COVID     Rx / DC Orders   ED Discharge Orders  Ordered    amoxicillin (AMOXIL) 875 MG tablet  2 times daily        09/05/22 1442             Note:  This document was prepared using Dragon voice recognition software and may include unintentional dictation errors.    Faythe Ghee, PA-C 09/05/22 1700    Pilar Jarvis, MD 09/05/22 [phone]

## 2022-09-05 NOTE — Discharge Instructions (Signed)
Follow-up with your regular doctor if not improving 3 days.  Return emergency department worsening.  Take medication as prescribed. 

## 2023-03-11 IMAGING — CR DG CHEST 2V
2 series · 2 of 2 positions shown · non-contrast
Comparison: Radiograph 10/31/2020, CT 04/12/2016

CLINICAL DATA: Productive cough. Chills and body aches. Positive
COVID exposure.

EXAM:
CHEST - 2 VIEW

[chest pa]
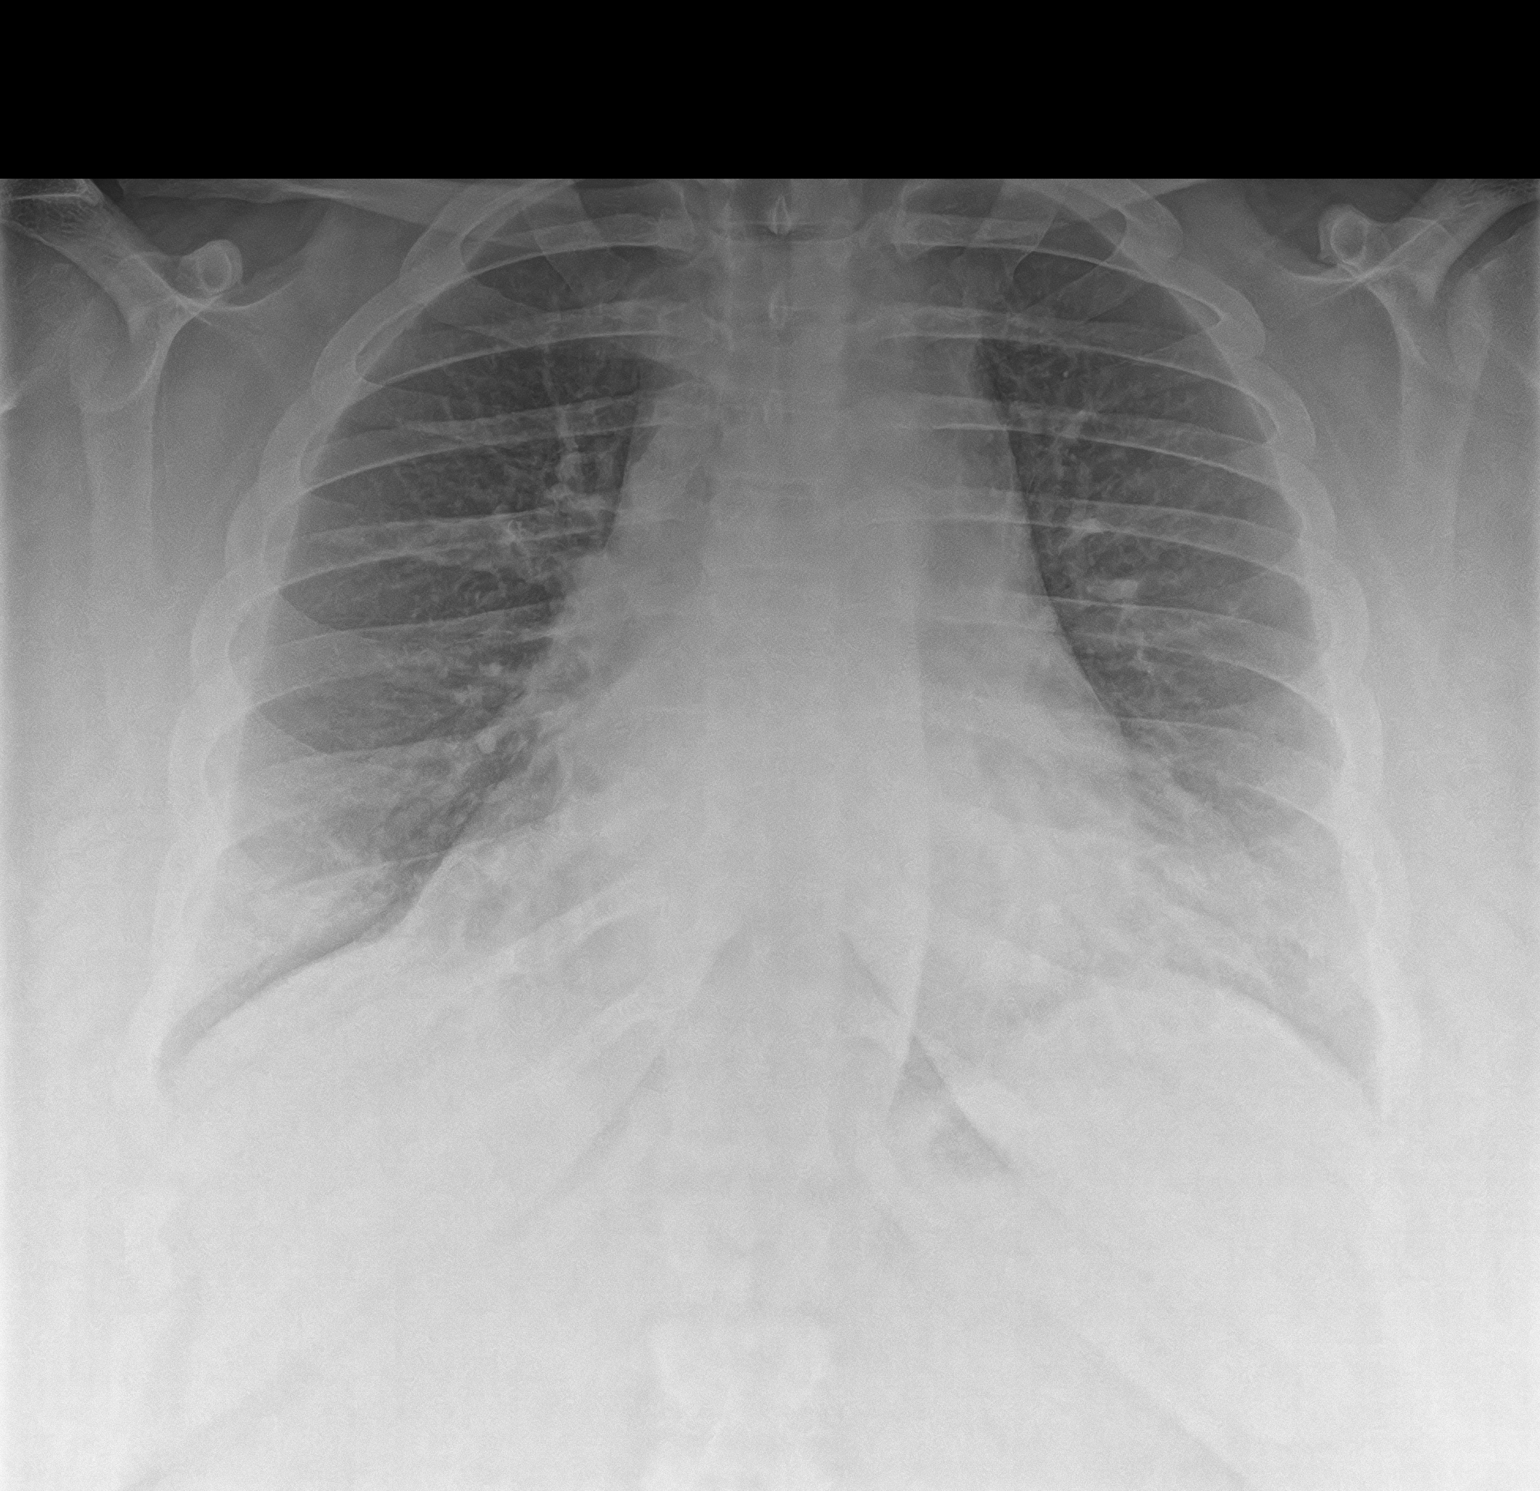

[chest lat]
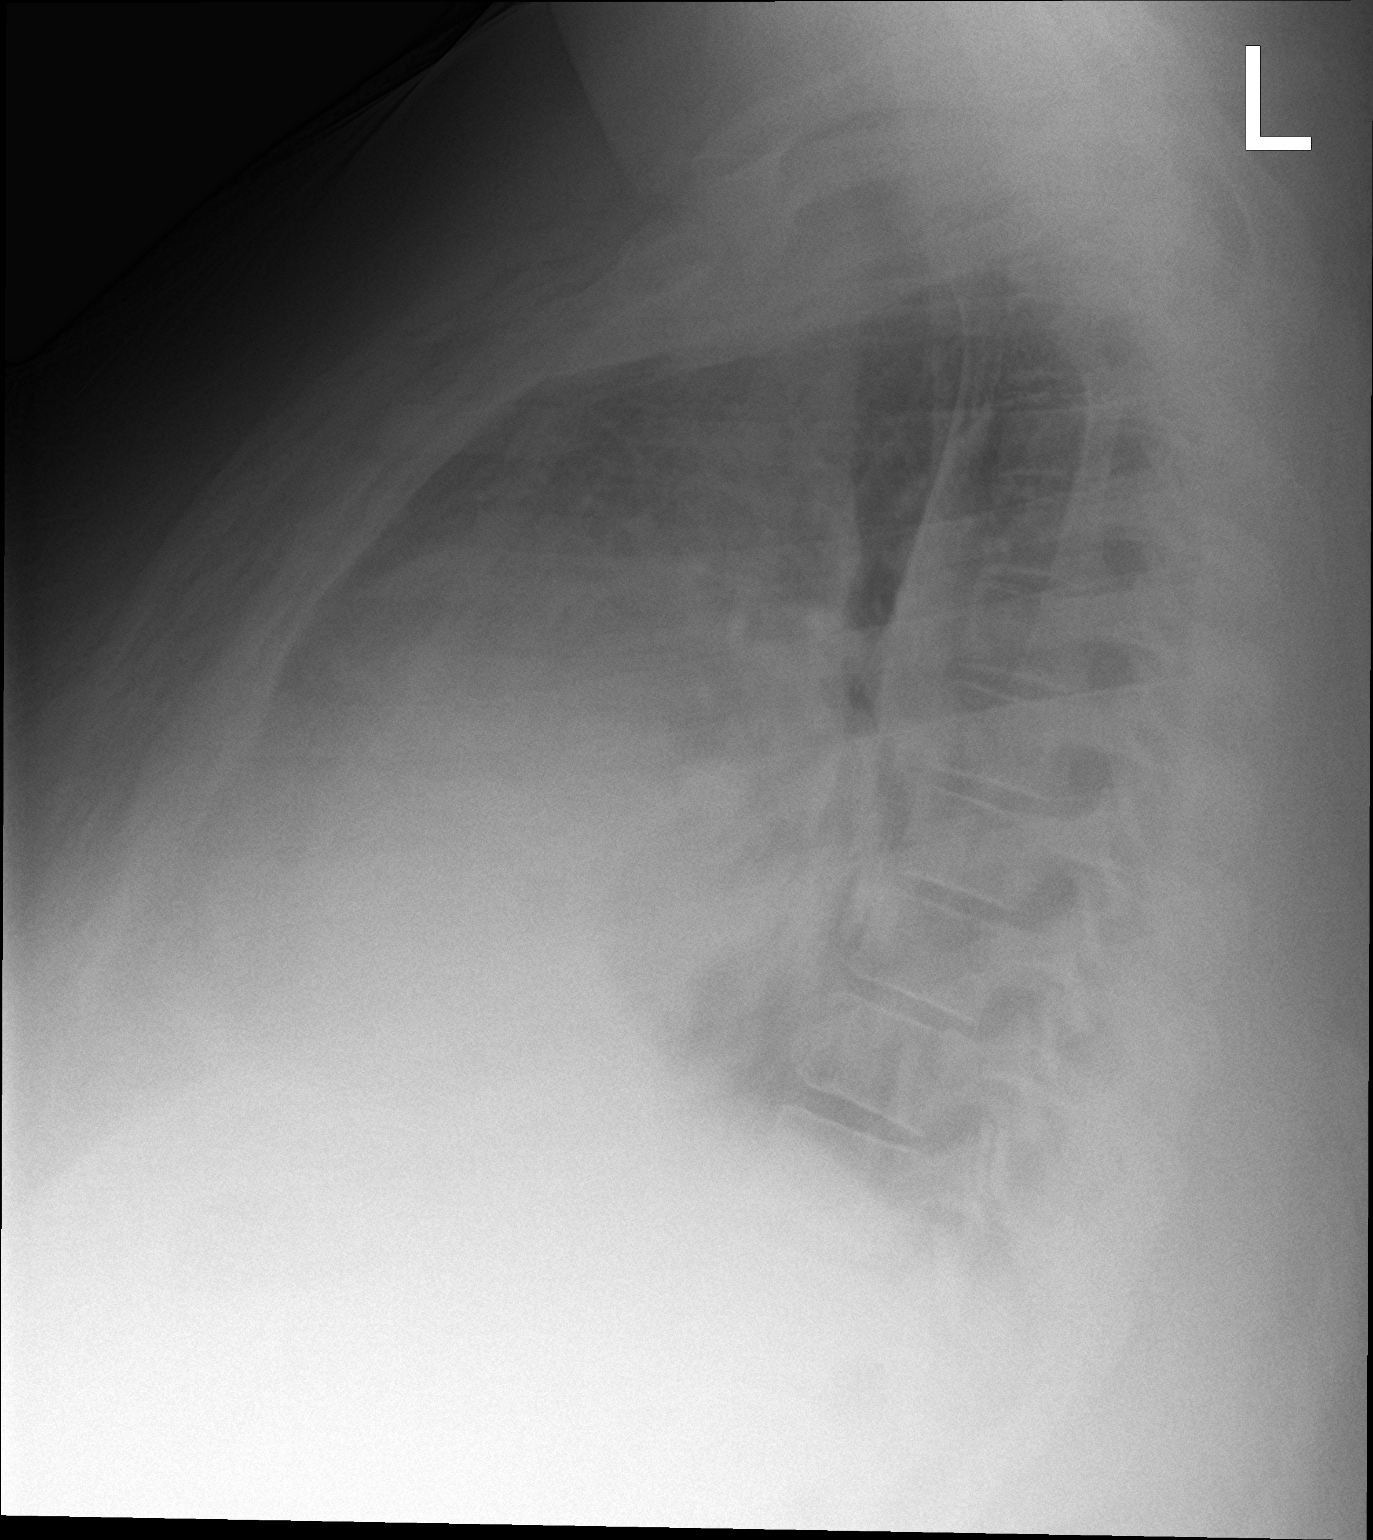

[2 of 2 positions shown; findings below may reference images not displayed]

FINDINGS: Upper normal heart size likely accentuated by mediastinal
lipomatosis when compared with prior CT. Stable mediastinal
contours. No acute airspace disease. No pulmonary edema, pleural
effusion, or pneumothorax. Soft tissue attenuation from habitus
limits assessment. No acute osseous abnormalities are seen.
IMPRESSION: No acute chest findings.

## 2024-11-09 ENCOUNTER — Other Ambulatory Visit: Payer: Self-pay | Admitting: Internal Medicine

## 2024-11-09 ENCOUNTER — Encounter: Payer: Self-pay | Admitting: Internal Medicine

## 2024-11-09 DIAGNOSIS — R600 Localized edema: Secondary | ICD-10-CM

## 2024-11-10 ENCOUNTER — Ambulatory Visit
Admission: RE | Admit: 2024-11-10 | Discharge: 2024-11-10 | Disposition: A | Discharge status: Home / Self Care | Source: Ambulatory Visit | Attending: Internal Medicine | Admitting: Internal Medicine

## 2024-11-10 DIAGNOSIS — R600 Localized edema: Secondary | ICD-10-CM | POA: Insufficient documentation

## 2024-12-09 ENCOUNTER — Encounter (INDEPENDENT_AMBULATORY_CARE_PROVIDER_SITE_OTHER): Payer: Self-pay | Admitting: Nurse Practitioner

## 2024-12-09 ENCOUNTER — Encounter (INDEPENDENT_AMBULATORY_CARE_PROVIDER_SITE_OTHER): Payer: Self-pay
